# Patient Record
Sex: Male | Born: 1995 | Race: Black or African American | Hispanic: No | Marital: Single | Smoking: Never smoker
Health system: Southern US, Community
[De-identification: ages and names within clinical notes are randomized; demographics above are authoritative.]

## PROBLEM LIST (undated history)

## (undated) DIAGNOSIS — J45909 Unspecified asthma, uncomplicated: Secondary | ICD-10-CM

## (undated) DIAGNOSIS — G40909 Epilepsy, unspecified, not intractable, without status epilepticus: Secondary | ICD-10-CM

## (undated) DIAGNOSIS — F319 Bipolar disorder, unspecified: Secondary | ICD-10-CM

## (undated) DIAGNOSIS — S29011S Strain of muscle and tendon of front wall of thorax, sequela: Secondary | ICD-10-CM

## (undated) HISTORY — PX: KNEE SURGERY: SHX244

## (undated) HISTORY — PX: HERNIA REPAIR: SHX51

---
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
## 2014-07-06 ENCOUNTER — Emergency Department
Admission: EM | Admit: 2014-07-06 | Discharge: 2014-07-06 | Disposition: A | Discharge status: Home / Self Care | Payer: Medicaid Other | Attending: Emergency Medicine | Admitting: Emergency Medicine

## 2014-07-06 ENCOUNTER — Emergency Department: Payer: Medicaid Other

## 2014-07-06 DIAGNOSIS — S61313A Laceration without foreign body of left middle finger with damage to nail, initial encounter: Secondary | ICD-10-CM | POA: Diagnosis not present

## 2014-07-06 DIAGNOSIS — Y93H2 Activity, gardening and landscaping: Secondary | ICD-10-CM | POA: Diagnosis not present

## 2014-07-06 DIAGNOSIS — F419 Anxiety disorder, unspecified: Secondary | ICD-10-CM | POA: Diagnosis not present

## 2014-07-06 DIAGNOSIS — Y998 Other external cause status: Secondary | ICD-10-CM | POA: Insufficient documentation

## 2014-07-06 DIAGNOSIS — Y9289 Other specified places as the place of occurrence of the external cause: Secondary | ICD-10-CM | POA: Insufficient documentation

## 2014-07-06 DIAGNOSIS — S6992XA Unspecified injury of left wrist, hand and finger(s), initial encounter: Secondary | ICD-10-CM | POA: Diagnosis present

## 2014-07-06 DIAGNOSIS — W28XXXA Contact with powered lawn mower, initial encounter: Secondary | ICD-10-CM | POA: Diagnosis not present

## 2014-07-06 DIAGNOSIS — S61319A Laceration without foreign body of unspecified finger with damage to nail, initial encounter: Secondary | ICD-10-CM

## 2014-07-06 HISTORY — DX: Unspecified asthma, uncomplicated: J45.909

## 2014-07-06 MED ORDER — NAPROXEN 500 MG PO TABS
ORAL_TABLET | ORAL | Status: AC
  Administered 2014-07-06: 500 mg via ORAL
  Filled 2014-07-06: qty 1

## 2014-07-06 MED ORDER — CEPHALEXIN 250 MG PO CAPS: 250.0000 mg | ORAL_CAPSULE | Freq: Four times a day (QID) | ORAL | Status: AC

## 2014-07-06 MED ORDER — NAPROXEN 500 MG PO TABS
500.0000 mg | ORAL_TABLET | Freq: Two times a day (BID) | ORAL | Status: DC
Start: 2014-07-06 — End: 2015-07-09

## 2014-07-06 MED ORDER — ACETAMINOPHEN 500 MG PO TABS
1000.0000 mg | ORAL_TABLET | Freq: Once | ORAL | Status: AC
  Administered 2014-07-06: 1000 mg via ORAL

## 2014-07-06 MED ORDER — NAPROXEN 500 MG PO TABS
500.0000 mg | ORAL_TABLET | Freq: Once | ORAL | Status: AC
  Administered 2014-07-06: 500 mg via ORAL

## 2014-07-06 MED ORDER — ACETAMINOPHEN 500 MG PO TABS
ORAL_TABLET | ORAL | Status: AC
  Filled 2014-07-06: qty 2

## 2014-07-06 NOTE — ED Notes (Signed)
Pt reports to ED via EMS.  Pt presents w/ finger injury.  Finger is wrapped.  Per EMS pt was mowing lawn when he stuck hand in lawn mower

## 2014-07-06 NOTE — ED Notes (Signed)
During MD assessment, patient informed staff that he did not want any meds that would make him drowsy. Less than 5 minutes post tylenol administration, patient's mother at nursing station appearing upset. Patient's mother states that the tylenol isn't working and she wants something stronger for patient. ED MD aware. Ice pack placed on patient's finger and naproxen administered. Patient continues to say he does not want any medications that will make him drowsy. Mom at bedside stating that patient told her he wanted meds to make him drowsy. Patient continues to state that he does not want any meds that could make him drowsy, stating, "I didn't say that... You're putting words in my mouth". Will continue to monitor. Call bell in reach.

## 2014-07-06 NOTE — ED Provider Notes (Signed)
[facility] Emergency Department Provider Note  ____________________________________________  Time seen: On arrival, via EMS  I have reviewed the triage vital signs and the nursing notes.   HISTORY  Chief Complaint Finger Injury   Patient is extremely anxious, there may be some sort of unspecified developmental delay  HPI Larry Ball is a 19 y.o. male presents to the emergency department after a left middle finger tip laceration. Patient apparently lifted a lawnmower while was turned on and had the end of his left middle finger struck by a blade. The laceration is through the distal tip of the fingernail but no partial or total avulsion. Patient is acutely anxious and family as well     Past Medical History  Diagnosis Date  . Asthma     There are no active problems to display for this patient.   Past Surgical History  Procedure Laterality Date  . Knee surgery      No current outpatient prescriptions on file.  Allergies Review of patient's allergies indicates no known allergies.  No family history on file.  Social History History  Substance Use Topics  . Smoking status: Never Smoker   . Smokeless tobacco: Not on file  . Alcohol Use: No    Review of Systems  Constitutional: Negative for fever.  Muscular skeletal: No other injuries Skin: Positive for laceration. Neurological: Negative for headaches or focal weakness Psychiatric: Positive for anxiety    ____________________________________________   PHYSICAL EXAM:  VITAL SIGNS: ED Triage Vitals  Enc Vitals Group     BP 07/06/14 0940 138/99 mmHg     Pulse Rate 07/06/14 0940 98     Resp 07/06/14 0940 16     Temp 07/06/14 0940 97.7 F (36.5 C)     Temp Source 07/06/14 0940 Oral     SpO2 07/06/14 0940 100 %     Weight --      Height --      Head Cir --      Peak Flow --      Pain Score 07/06/14 0941 10     Pain Loc --      Pain Edu? --      Excl. in GC? --       Constitutional: Alert and oriented. Well appearing but anxious  ENT   Head: Normocephalic and atraumatic.   Cardiovascular: Normal rate, regular rhythm. Normal and symmetric distal pulses are present in all extremities. No murmurs, rubs, or gallops. Respiratory: Normal respiratory effort without tachypnea nor retractions. Breath sounds are clear and equal bilaterally.   Musculoskeletal: Nontender with normal range of motion in all extremities. No lower extremity tenderness nor edema. Neurologic:  Normal speech and language. No gross focal neurologic deficits are appreciated. Skin:  Skin is warm, dry and intact. Patient with laceration through the dorsal, distal nailbed of left middle finger. Germinal matrix intact. Palmar surface with superficial laceration. Mild bleeding present Psychiatric: Patient is extremely anxious  ____________________________________________    LABS (pertinent positives/negatives)  Labs Reviewed - No data to display  ____________________________________________   EKG  None  ____________________________________________    RADIOLOGY  X-ray Reviewed by me.  Nondisplaced fracture distal aspect third distal phalanx   ____________________________________________   PROCEDURES  Procedure(s) performed: none  Critical Care performed:none  ____________________________________________   INITIAL IMPRESSION / ASSESSMENT AND PLAN / ED COURSE  Pertinent labs & imaging results that were available during my care of the patient were reviewed by me and considered in my  medical decision making (see chart for details).  Patient with distal nailbed injury. We will give Tylenol by mouth x-ray and ice the area. Mother is extremely angry at me for giving Tylenol and not something stronger although patient himself requested nothing that will make him sleepy. Reinforced that this is not a severe injury and that Tylenol is an appropriate first step until  we get an x-ray.  ----------------------------------------- 10:03 AM on 07/06/2014 -----------------------------------------  Mother is raising her voice at the nurse regarding Tylenol which was only given 3 minutes ago. The patient again reiterates that he does not want anything that makes him sleepy and is not requesting further medication  ____________________________________________ X-ray shows nondisplaced fracture of the distal aspect of the third distal phalanx. Wound carefully irrigated in the emergency department. We will bandage and prescribe antibiotics and have the patient follow up with orthopedics as needed. Mother agrees with this plan and is happier because patient is comfortable after naproxen and Tylenol and ice pack.  FINAL CLINICAL IMPRESSION(S) / ED DIAGNOSES  Final diagnoses:  Laceration of finger nail bed, initial encounter     Jene Every, MD 07/06/14 1054

## 2014-07-06 NOTE — Discharge Instructions (Signed)
Finger Avulsion  °When the tip of the finger is lost, a new nail may grow back if part of the fingernail is left. The new nail may be deformed. If just the tip of the finger is lost, no repair may be needed unless there is bone showing. If bone is showing, your caregiver may need to remove the protruding bone and put on a bandage. Your caregiver will do what is best for you. Most of the time when a fingertip is lost, the end will gradually grow back on and look fairly normal, but it may remain sensitive to pressure and temperature extremes for a long time. °HOME CARE INSTRUCTIONS  °· Keep your hand elevated above your heart to relieve pain and swelling. °· Keep your dressing dry and clean. °· Change your bandage in 24 hours or as directed. °· Only take over-the-counter or prescription medicines for pain, discomfort, or fever as directed by your caregiver. °· See your caregiver as needed for problems. °SEEK MEDICAL CARE IF:  °· You have increased pain, swelling, drainage, or bleeding. °· You have a fever. °· You have swelling that spreads from your finger and into your hand. °Make sure to check to see if you need a tetanus booster. °Document Released: 03/09/2001 Document Revised: 06/30/2011 Document Reviewed: 02/02/2008 °ExitCare® Patient Information ©2015 [employer]. This information is not intended to replace advice given to you by your health care provider. Make sure you discuss any questions you have with your health care provider. ° °

## 2014-11-08 ENCOUNTER — Encounter: Payer: Self-pay | Admitting: Emergency Medicine

## 2014-11-08 ENCOUNTER — Emergency Department
Admission: EM | Admit: 2014-11-08 | Discharge: 2014-11-08 | Disposition: A | Discharge status: Home / Self Care | Payer: Medicare Other | Attending: Emergency Medicine | Admitting: Emergency Medicine

## 2014-11-08 ENCOUNTER — Emergency Department: Payer: Medicare Other

## 2014-11-08 DIAGNOSIS — Z791 Long term (current) use of non-steroidal anti-inflammatories (NSAID): Secondary | ICD-10-CM | POA: Diagnosis not present

## 2014-11-08 DIAGNOSIS — M25461 Effusion, right knee: Secondary | ICD-10-CM

## 2014-11-08 DIAGNOSIS — M25561 Pain in right knee: Secondary | ICD-10-CM | POA: Diagnosis present

## 2014-11-08 DIAGNOSIS — Z79899 Other long term (current) drug therapy: Secondary | ICD-10-CM | POA: Diagnosis not present

## 2014-11-08 HISTORY — DX: Bipolar disorder, unspecified: F31.9

## 2014-11-08 MED ORDER — NAPROXEN 500 MG PO TABS
ORAL_TABLET | ORAL | Status: AC
  Administered 2014-11-08: 500 mg via ORAL
  Filled 2014-11-08: qty 1

## 2014-11-08 MED ORDER — IBUPROFEN 800 MG PO TABS: 800.0000 mg | ORAL_TABLET | Freq: Three times a day (TID) | ORAL | Status: DC | PRN

## 2014-11-08 MED ORDER — NAPROXEN 500 MG PO TABS
500.0000 mg | ORAL_TABLET | Freq: Once | ORAL | Status: AC
  Administered 2014-11-08: 500 mg via ORAL

## 2014-11-08 NOTE — Discharge Instructions (Signed)
Knee Effusion °Knee effusion means that you have extra fluid in your knee. This can cause pain. Your knee may be more difficult to bend and move. °HOME CARE °· Use crutches as told by your doctor. °· Wear a knee brace as told by your doctor. °· Apply ice to the swollen area: °¨ Put ice in a plastic bag. °¨ Place a towel between your skin and the bag. °¨ Leave the ice on for 20 minutes, 2-3 times per day. °· Keep your knee raised (elevated) when you are sitting or lying down. °· Take medicines only as told by your doctor. °· Do any rehabilitation or strengthening exercises as told by your doctor. °· Rest your knee as told by your doctor. You may start doing your normal activities again when your doctor says it is okay. °· Keep all follow-up visits as told by your doctor. This is important. °GET HELP IF:  °· You continue to have pain in your knee. °GET HELP RIGHT AWAY IF: °· You have increased swelling or redness of your knee. °· You have severe pain in your knee. °· You have a fever. °  °This information is not intended to replace advice given to you by your health care provider. Make sure you discuss any questions you have with your health care provider. °  °Document Released: 01/31/2010 Document Revised: 01/19/2014 Document Reviewed: 08/14/2013 °Elsevier Interactive Patient Education ©2016 [employer] ° °

## 2014-11-08 NOTE — ED Notes (Signed)
Pt to ed with c/o right leg pain, states hx of ACL repair on right knee last year.

## 2014-11-08 NOTE — ED Provider Notes (Signed)
[facility] Emergency Department Provider Note  ____________________________________________  Time seen: Approximately 2:50 PM  I have reviewed the triage vital signs and the nursing notes.   HISTORY  Chief Complaint Leg Pain   HPI Larry Ball is a 19 y.o. male who presents to the emergency department for evaluation of right knee pain. He states that he tore his anterior cruciate ligament and had surgery last February, however a few days ago the pain returned. The pain is different than before as it is only on the inside of his knee. He denies new injury.Pain is worse with ambulation.   Past Medical History  Diagnosis Date  . Asthma   . Bipolar 1 disorder (HCC)     There are no active problems to display for this patient.   Past Surgical History  Procedure Laterality Date  . Knee surgery    . Hernia repair      Current Outpatient Rx  Name  Route  Sig  Dispense  Refill  . lithium 300 MG tablet   Oral   Take 300 mg by mouth 3 (three) times daily.         Marland Kitchen ibuprofen (ADVIL,MOTRIN) 800 MG tablet   Oral   Take 1 tablet (800 mg total) by mouth every 8 (eight) hours as needed.   30 tablet   0   . naproxen (NAPROSYN) 500 MG tablet   Oral   Take 1 tablet (500 mg total) by mouth 2 (two) times daily with a meal.   20 tablet   2     Allergies Review of patient's allergies indicates no known allergies.  History reviewed. No pertinent family history.  Social History Social History  Substance Use Topics  . Smoking status: Never Smoker   . Smokeless tobacco: None  . Alcohol Use: No    Review of Systems Constitutional: No recent illness. Eyes: No visual changes. ENT: No sore throat. Cardiovascular: Denies chest pain or palpitations. Respiratory: Denies shortness of breath. Gastrointestinal: No abdominal pain.  Genitourinary: Negative for dysuria. Musculoskeletal: Pain in right knee Skin: Negative for rash. Neurological:  Negative for headaches, focal weakness or numbness. 10-point ROS otherwise negative.  ____________________________________________   PHYSICAL EXAM:  VITAL SIGNS: ED Triage Vitals  Enc Vitals Group     BP 11/08/14 1334 141/84 mmHg     Pulse Rate 11/08/14 1334 65     Resp 11/08/14 1334 20     Temp 11/08/14 1334 97.7 F (36.5 C)     Temp Source 11/08/14 1334 Oral     SpO2 11/08/14 1334 100 %     Weight 11/08/14 1334 202 lb (91.627 kg)     Height 11/08/14 1334 6' (1.829 m)     Head Cir --      Peak Flow --      Pain Score 11/08/14 1334 9     Pain Loc --      Pain Edu? --      Excl. in GC? --     Constitutional: Alert and oriented. Well appearing and in no acute distress. Eyes: Conjunctivae are normal. EOMI. Head: Atraumatic. Nose: No congestion/rhinnorhea. Neck: No stridor.  Respiratory: Normal respiratory effort.   Musculoskeletal: Medial joint line tenderness most noted on extension against resistance. Knee is tracking midline without crepitus. Negative anterior drawer test. Straight leg raise is normal. Neurologic:  Normal speech and language. No gross focal neurologic deficits are appreciated. Speech is normal. No gait instability. Skin:  Skin is  warm, dry and intact. Atraumatic. Psychiatric: Mood and affect are normal. Speech and behavior are normal.  ____________________________________________   LABS (all labs ordered are listed, but only abnormal results are displayed)  Labs Reviewed - No data to display ____________________________________________  RADIOLOGY  Tiny joint effusion noted on the x-ray.. ____________________________________________   PROCEDURES  Procedure(s) performed:   Knee immobilizer applied to the right knee by ER tech. Patient was neurovascularly intact post-application.   ____________________________________________   INITIAL IMPRESSION / ASSESSMENT AND PLAN / ED COURSE  Pertinent labs & imaging results that were available  during my care of the patient were reviewed by me and considered in my medical decision making (see chart for details).  Patient was advised to follow-up with orthopedics. He was advised to return to the emergency department for symptoms that change or worsen if he is unable schedule an appointment. ____________________________________________   FINAL CLINICAL IMPRESSION(S) / ED DIAGNOSES  Final diagnoses:  Effusion of knee joint right        Chinita PesterCari B Calianne Larue, FNP 11/08/14 1728  Emily FilbertJonathan E Williams, MD 11/09/14 2248

## 2014-11-08 NOTE — ED Notes (Signed)
States he had surgery about 1 year ago has had some pain but not pain is increased   No swelling noted pain is to lateral aspect ambulates well to treatment area

## 2015-04-21 ENCOUNTER — Emergency Department
Admission: EM | Admit: 2015-04-21 | Discharge: 2015-04-21 | Disposition: A | Discharge status: Home / Self Care | Payer: Medicare Other | Attending: Emergency Medicine | Admitting: Emergency Medicine

## 2015-04-21 ENCOUNTER — Encounter: Payer: Self-pay | Admitting: Emergency Medicine

## 2015-04-21 DIAGNOSIS — F319 Bipolar disorder, unspecified: Secondary | ICD-10-CM | POA: Insufficient documentation

## 2015-04-21 DIAGNOSIS — J45909 Unspecified asthma, uncomplicated: Secondary | ICD-10-CM | POA: Insufficient documentation

## 2015-04-21 DIAGNOSIS — Z79899 Other long term (current) drug therapy: Secondary | ICD-10-CM | POA: Insufficient documentation

## 2015-04-21 DIAGNOSIS — Z791 Long term (current) use of non-steroidal anti-inflammatories (NSAID): Secondary | ICD-10-CM | POA: Diagnosis not present

## 2015-04-21 DIAGNOSIS — R040 Epistaxis: Secondary | ICD-10-CM | POA: Diagnosis present

## 2015-04-21 MED ORDER — FLUTICASONE PROPIONATE 50 MCG/ACT NA SUSP: 1.0000 | Freq: Two times a day (BID) | NASAL | Status: DC

## 2015-04-21 MED ORDER — CETIRIZINE HCL 10 MG PO TABS: 10.0000 mg | ORAL_TABLET | Freq: Every day | ORAL | Status: DC

## 2015-04-21 NOTE — Discharge Instructions (Signed)

## 2015-04-21 NOTE — ED Notes (Signed)
Pt presents with epistaxis this afternoon while cutting grass. No active bleeding now. Pt denies any injury to nose. Pt states has had rhinorrhea and reports seasonal allergies. Dried blood noted to nostrils and face.

## 2015-04-21 NOTE — ED Provider Notes (Signed)
[facility] Emergency Department Provider Note  ____________________________________________  Time seen: Approximately 4:46 PM  I have reviewed the triage vital signs and the nursing notes.   HISTORY  Chief Complaint Epistaxis    HPI Larry Ball is a 20 y.o. male who presents to emergency department for complaint of nosebleed this afternoon. Patient states that he does have seasonal allergies and was suffering from sneezing and runny nose this afternoon. He states that he was rubbing his nose began to bleed. 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 He was concerned and sought out his father who applied ice and direct pressure. After this, nosebleed was successfully stopped. Patient has had no return of symptoms. Patient denies any headache, visual changes, neck pain, chest pain, shortness of breath, nausea or vomiting.   Past Medical History  Diagnosis Date  . Asthma   . Bipolar 1 disorder (HCC)     There are no active problems to display for this patient.   Past Surgical History  Procedure Laterality Date  . Knee surgery    . Hernia repair      Current Outpatient Rx  Name  Route  Sig  Dispense  Refill  . cetirizine (ZYRTEC) 10 MG tablet   Oral   Take 1 tablet (10 mg total) by mouth daily.   30 tablet   0   . fluticasone (FLONASE) 50 MCG/ACT nasal spray   Each Nare   Place 1 spray into both nostrils 2 (two) times daily.   16 g   0   . ibuprofen (ADVIL,MOTRIN) 800 MG tablet   Oral   Take 1 tablet (800 mg total) by mouth every 8 (eight) hours as needed.   30 tablet   0   . lithium 300 MG tablet   Oral   Take 300 mg by mouth 3 (three) times daily.         . naproxen (NAPROSYN) 500 MG tablet   Oral   Take 1 tablet (500 mg total) by mouth 2 (two) times daily with a meal.   20 tablet   2     Allergies Review of patient's allergies indicates no known allergies.  History reviewed. No pertinent family history.  Social  History Social History  Substance Use Topics  . Smoking status: Never Smoker   . Smokeless tobacco: None  . Alcohol Use: No     Review of Systems  Constitutional: No fever/chills Eyes: No visual changes. No discharge ENT: No sore throat.Positive for rhinorrhea. Positive for epistaxis. Denies ear pain. Cardiovascular: no chest pain. Respiratory: no cough. No SOB. Skin: Negative for rash. Neurological: Negative for headaches, focal weakness or numbness. 10-point ROS otherwise negative.  ____________________________________________   PHYSICAL EXAM:  VITAL SIGNS: ED Triage Vitals  Enc Vitals Group     BP --      Pulse --      Resp --      Temp --      Temp src --      SpO2 --      Weight --      Height --      Head Cir --      Peak Flow --      Pain Score --      Pain Loc --      Pain Edu? --      Excl. in GC? --      Constitutional: Alert and oriented. Well appearing and in no acute distress. Eyes:  Conjunctivae are normal. PERRL. EOMI. Head: Atraumatic. ENT:      Ears: EACs and TMs are unremarkable bilaterally.      Nose: No congestion/rhinnorhea. Turbinates are boggy. There is scabbing over in the left nares over the Kiesselbach plexus. No current bleeding.      Mouth/Throat: Mucous membranes are moist. Oropharynx is nonerythematous and nonedematous. Neck: No stridor.   Cardiovascular: Normal rate, regular rhythm. Normal S1 and S2.  Good peripheral circulation. Respiratory: Normal respiratory effort without tachypnea or retractions. Lungs CTAB. Neurologic:  Normal speech and language. No gross focal neurologic deficits are appreciated.  Skin:  Skin is warm, dry and intact. No rash noted. Psychiatric: Mood and affect are normal. Speech and behavior are normal.   ____________________________________________   LABS (all labs ordered are listed, but only abnormal results are displayed)  Labs Reviewed - No data to  display ____________________________________________  EKG   ____________________________________________  RADIOLOGY   No results found.  ____________________________________________    PROCEDURES  Procedure(s) performed:       Medications - No data to display   ____________________________________________   INITIAL IMPRESSION / ASSESSMENT AND PLAN / ED COURSE  Pertinent labs & imaging results that were available during my care of the patient were reviewed by me and considered in my medical decision making (see chart for details).  Patient's diagnosis is consistent with epistaxis. Currently there is no bleeding. No need for Afrin or nasal packing. Patient does have a significant history of seasonal allergies and it is felt that epistaxis is directly related to this. As such, Patient will be discharged home with prescriptions for Flonase and Zyrtec. Patient is to follow up with ENT if symptoms persist past this treatment course. Patient is given ED precautions to return to the ED for any worsening or new symptoms.     ____________________________________________  FINAL CLINICAL IMPRESSION(S) / ED DIAGNOSES  Final diagnoses:  Epistaxis      NEW MEDICATIONS STARTED DURING THIS VISIT:  Discharge Medication List as of 04/21/2015  4:42 PM    START taking these medications   Details  cetirizine (ZYRTEC) 10 MG tablet Take 1 tablet (10 mg total) by mouth daily., Starting 04/21/2015, Until Discontinued, Print    fluticasone (FLONASE) 50 MCG/ACT nasal spray Place 1 spray into both nostrils 2 (two) times daily., Starting 04/21/2015, Until Discontinued, Print            This chart was dictated using voice recognition software/Dragon. Despite best efforts to proofread, errors can occur which can change the meaning. Any change was purely unintentional.    Racheal PatchesJonathan D Kemaria Dedic, PA-C 04/21/15 1703  Sharman CheekPhillip Stafford, MD 04/22/15 2351

## 2015-04-21 NOTE — ED Notes (Signed)
Had a nose bleed at home and was spitting up blood from it

## 2015-04-21 NOTE — ED Notes (Signed)
Mother very aggressive - had to be escorted out by officers. Mother accusing me during triage of being prejudice but this nurse unsure why since i was asking symptom onset

## 2015-04-22 MED ORDER — ADENOSINE 6 MG/2ML IV SOLN
INTRAVENOUS | Status: AC
  Filled 2015-04-22: qty 2

## 2015-07-09 ENCOUNTER — Emergency Department
Admission: EM | Admit: 2015-07-09 | Discharge: 2015-07-09 | Disposition: A | Discharge status: Home / Self Care | Payer: Medicare Other | Attending: Emergency Medicine | Admitting: Emergency Medicine

## 2015-07-09 DIAGNOSIS — S0006XD Insect bite (nonvenomous) of scalp, subsequent encounter: Secondary | ICD-10-CM | POA: Diagnosis present

## 2015-07-09 DIAGNOSIS — Z79899 Other long term (current) drug therapy: Secondary | ICD-10-CM | POA: Diagnosis not present

## 2015-07-09 DIAGNOSIS — W57XXXD Bitten or stung by nonvenomous insect and other nonvenomous arthropods, subsequent encounter: Secondary | ICD-10-CM | POA: Insufficient documentation

## 2015-07-09 DIAGNOSIS — J45909 Unspecified asthma, uncomplicated: Secondary | ICD-10-CM | POA: Insufficient documentation

## 2015-07-09 DIAGNOSIS — F319 Bipolar disorder, unspecified: Secondary | ICD-10-CM | POA: Insufficient documentation

## 2015-07-09 MED ORDER — DOXYCYCLINE HYCLATE 50 MG PO CAPS: 100.0000 mg | ORAL_CAPSULE | Freq: Two times a day (BID) | ORAL | Status: DC

## 2015-07-09 MED ORDER — ONDANSETRON HCL 4 MG/2ML IJ SOLN
INTRAMUSCULAR | Status: AC
Start: 2015-07-09 — End: 2015-07-10
  Filled 2015-07-09: qty 2

## 2015-07-09 NOTE — ED Notes (Addendum)
Pt reports he's pulled 4 ticks off of him, wants his hair checked for more- pt with dreadlocks. Pt also states "I need to be checked for rocky mountain spotted fever"; reports headache. Pt ambulatory to triage, no difficulty or distress noted. Pt unsure if any actual tick bite.

## 2015-07-09 NOTE — ED Notes (Signed)
Pt informed to return if any life threatening symptoms occur.  

## 2015-07-09 NOTE — ED Notes (Signed)
See triage note. States he was in an area that was full of ticks this am and found several ticks on him

## 2015-07-09 NOTE — ED Provider Notes (Signed)
[facility] Emergency Department Provider Note  ____________________________________________  Time seen: Approximately 1:18 PM  I have reviewed the triage vital signs and the nursing notes.   HISTORY  Chief Complaint ticks in hair     HPI Larry Ball is a 20 y.o. male presents for evaluation of exposure to ticks prior to arrival. Patient states he is walking and woods and found 45 ticks on room and wants to be examined for any remaining ticks. Patient also wants to be tested for [facility] and Lyme disease.   Past Medical History  Diagnosis Date  . Asthma   . Bipolar 1 disorder (HCC)     There are no active problems to display for this patient.   Past Surgical History  Procedure Laterality Date  . Knee surgery    . Hernia repair      Current Outpatient Rx  Name  Route  Sig  Dispense  Refill  . doxycycline (VIBRAMYCIN) 50 MG capsule   Oral   Take 2 capsules (100 mg total) by mouth 2 (two) times daily.   2 capsule   0   . lithium 300 MG tablet   Oral   Take 300 mg by mouth 3 (three) times daily.           Allergies Review of patient's allergies indicates no known allergies.  No family history on file.  Social History Social History  Substance Use Topics  . Smoking status: Never Smoker   . Smokeless tobacco: Not on file  . Alcohol Use: No    Review of Systems Constitutional: No fever/chills Eyes: No visual changes. ENT: No sore throat. Cardiovascular: Denies chest pain. Respiratory: Denies shortness of breath. Gastrointestinal: No abdominal pain.  No nausea, no vomiting.  No diarrhea.  No constipation. Genitourinary: Negative for dysuria. Musculoskeletal: Negative for back pain. Skin: Negative for rash.Positive for tick exposure. Neurological: Negative for headaches, focal weakness or numbness.  10-point ROS otherwise negative.  ____________________________________________   PHYSICAL  EXAM:  VITAL SIGNS: ED Triage Vitals  Enc Vitals Group     BP 07/09/15 1245 145/87 mmHg     Pulse Rate 07/09/15 1245 65     Resp 07/09/15 1245 20     Temp 07/09/15 1245 97.9 F (36.6 C)     Temp Source 07/09/15 1245 Oral     SpO2 07/09/15 1245 97 %     Weight 07/09/15 1245 210 lb (95.255 kg)     Height 07/09/15 1245 5\' 10"  (1.778 m)     Head Cir --      Peak Flow --      Pain Score 07/09/15 1245 9     Pain Loc --      Pain Edu? --      Excl. in GC? --     Constitutional: Alert and oriented. Well appearing and in no acute distress. Head: Atraumatic. Nose: No congestion/rhinnorhea. Mouth/Throat: Mucous membranes are moist.  Oropharynx non-erythematous. Neck: No stridor.   Cardiovascular: Normal rate, regular rhythm. Grossly normal heart sounds.  Good peripheral circulation. Respiratory: Normal respiratory effort.  No retractions. Lungs CTAB. Gastrointestinal: Soft and nontender. No distention. No abdominal bruits. No CVA tenderness. Musculoskeletal: No lower extremity tenderness nor edema.  No joint effusions. Neurologic:  Normal speech and language. No gross focal neurologic deficits are appreciated. No gait instability. Skin:  Skin is warm, dry and intact. No rash noted.No evidence of ticks noted. Psychiatric: Mood and affect are normal. Speech and  behavior are normal.  ____________________________________________   LABS (all labs ordered are listed, but only abnormal results are displayed)  Labs Reviewed  ROCKY MTN SPOTTED FVR ABS PNL(IGG+IGM)  B. BURGDORFI ANTIBODIES   ____________________________________________  EKG   ____________________________________________  RADIOLOGY   ____________________________________________   PROCEDURES  Procedure(s) performed: None  Critical Care performed: No  ____________________________________________   INITIAL IMPRESSION / ASSESSMENT AND PLAN / ED COURSE  Pertinent labs & imaging results that were available  during my care of the patient were reviewed by me and considered in my medical decision making (see chart for details).  Tick exposure. Reassurance provided to the patient labs drawn for [facility] and Lyme disease. Patient was given doxycycline 100 mg by mouth 1 dose. ____________________________________________   FINAL CLINICAL IMPRESSION(S) / ED DIAGNOSES  Final diagnoses:  Tick bite of scalp, subsequent encounter     This chart was dictated using voice recognition software/Dragon. Despite best efforts to proofread, errors can occur which can change the meaning. Any change was purely unintentional.   Evangeline Dakinharles M Amedio Bowlby, PA-C 07/09/15 1418  Emily FilbertJonathan E Williams, MD 07/09/15 [phone]

## 2015-07-11 LAB — ROCKY MTN SPOTTED FVR ABS PNL(IGG+IGM)
RMSF IGG: NEGATIVE
RMSF IgM: 0.35 index (ref 0.00–0.89)

## 2015-07-11 LAB — B. BURGDORFI ANTIBODIES

## 2015-07-12 ENCOUNTER — Telehealth: Payer: Self-pay | Admitting: Emergency Medicine

## 2015-07-12 NOTE — ED Notes (Signed)
Called patient to give results of rmsf/lyme and discuss follow up plans.  No answer and no voicemail.  Also noticed doxycycline rx has quantity of only 2--wanted to ask patient if he received enough for 10 days from pharmacy or just the 2.

## 2015-11-26 ENCOUNTER — Encounter: Payer: Self-pay | Admitting: Emergency Medicine

## 2015-11-26 ENCOUNTER — Emergency Department: Payer: Medicare Other

## 2015-11-26 ENCOUNTER — Emergency Department
Admission: EM | Admit: 2015-11-26 | Discharge: 2015-11-26 | Disposition: A | Discharge status: Home / Self Care | Payer: Medicare Other | Attending: Emergency Medicine | Admitting: Emergency Medicine

## 2015-11-26 DIAGNOSIS — R1032 Left lower quadrant pain: Secondary | ICD-10-CM | POA: Diagnosis not present

## 2015-11-26 DIAGNOSIS — R112 Nausea with vomiting, unspecified: Secondary | ICD-10-CM | POA: Insufficient documentation

## 2015-11-26 DIAGNOSIS — J45909 Unspecified asthma, uncomplicated: Secondary | ICD-10-CM | POA: Diagnosis not present

## 2015-11-26 DIAGNOSIS — Z79899 Other long term (current) drug therapy: Secondary | ICD-10-CM | POA: Insufficient documentation

## 2015-11-26 LAB — URINALYSIS COMPLETE WITH MICROSCOPIC (ARMC ONLY)
BILIRUBIN URINE: NEGATIVE
Bacteria, UA: NONE SEEN
Glucose, UA: NEGATIVE mg/dL
Hgb urine dipstick: NEGATIVE
KETONES UR: NEGATIVE mg/dL
Leukocytes, UA: NEGATIVE
Nitrite: NEGATIVE
Protein, ur: 30 mg/dL — AB
Specific Gravity, Urine: 1.027 (ref 1.005–1.030)
pH: 5 (ref 5.0–8.0)

## 2015-11-26 LAB — COMPREHENSIVE METABOLIC PANEL
ALT: 28 U/L (ref 17–63)
AST: 26 U/L (ref 15–41)
Albumin: 4.3 g/dL (ref 3.5–5.0)
Alkaline Phosphatase: 60 U/L (ref 38–126)
Anion gap: 8 (ref 5–15)
BUN: 16 mg/dL (ref 6–20)
CHLORIDE: 105 mmol/L (ref 101–111)
CO2: 26 mmol/L (ref 22–32)
Calcium: 9.2 mg/dL (ref 8.9–10.3)
Creatinine, Ser: 1.11 mg/dL (ref 0.61–1.24)
Glucose, Bld: 132 mg/dL — ABNORMAL HIGH (ref 65–99)
Potassium: 4.2 mmol/L (ref 3.5–5.1)
Sodium: 139 mmol/L (ref 135–145)
Total Bilirubin: 0.6 mg/dL (ref 0.3–1.2)
Total Protein: 8 g/dL (ref 6.5–8.1)

## 2015-11-26 LAB — CBC
HCT: 47 % (ref 40.0–52.0)
Hemoglobin: 16.1 g/dL (ref 13.0–18.0)
MCH: 30.7 pg (ref 26.0–34.0)
MCHC: 34.2 g/dL (ref 32.0–36.0)
MCV: 89.6 fL (ref 80.0–100.0)
PLATELETS: 289 10*3/uL (ref 150–440)
RBC: 5.25 MIL/uL (ref 4.40–5.90)
RDW: 12.3 % (ref 11.5–14.5)
WBC: 13.5 10*3/uL — ABNORMAL HIGH (ref 3.8–10.6)

## 2015-11-26 LAB — LIPASE, BLOOD: LIPASE: 22 U/L (ref 11–51)

## 2015-11-26 MED ORDER — MORPHINE SULFATE (PF) 4 MG/ML IV SOLN
INTRAVENOUS | Status: AC
  Administered 2015-11-26: 4 mg via INTRAVENOUS
  Filled 2015-11-26: qty 1

## 2015-11-26 MED ORDER — GI COCKTAIL ~~LOC~~
30.0000 mL | Freq: Once | ORAL | Status: AC
  Administered 2015-11-26: 30 mL via ORAL
  Filled 2015-11-26: qty 30

## 2015-11-26 MED ORDER — METOCLOPRAMIDE HCL 10 MG PO TABS: 10.0000 mg | ORAL_TABLET | Freq: Three times a day (TID) | ORAL | 0 refills | Status: DC | PRN

## 2015-11-26 MED ORDER — IOPAMIDOL (ISOVUE-300) INJECTION 61%
30.0000 mL | Freq: Once | INTRAVENOUS | Status: AC | PRN
  Administered 2015-11-26: 30 mL via ORAL

## 2015-11-26 MED ORDER — ONDANSETRON HCL 4 MG/2ML IJ SOLN
4.0000 mg | Freq: Once | INTRAMUSCULAR | Status: AC
  Administered 2015-11-26: 4 mg via INTRAVENOUS

## 2015-11-26 MED ORDER — SODIUM CHLORIDE 0.9 % IV BOLUS (SEPSIS)
1000.0000 mL | Freq: Once | INTRAVENOUS | Status: AC
  Administered 2015-11-26: 1000 mL via INTRAVENOUS

## 2015-11-26 MED ORDER — IOPAMIDOL (ISOVUE-300) INJECTION 61%
100.0000 mL | Freq: Once | INTRAVENOUS | Status: AC | PRN
  Administered 2015-11-26: 100 mL via INTRAVENOUS

## 2015-11-26 MED ORDER — ETODOLAC 200 MG PO CAPS: 200.0000 mg | ORAL_CAPSULE | Freq: Three times a day (TID) | ORAL | 0 refills | Status: DC

## 2015-11-26 MED ORDER — KETOROLAC TROMETHAMINE 30 MG/ML IJ SOLN
30.0000 mg | Freq: Once | INTRAMUSCULAR | Status: AC
  Administered 2015-11-26: 30 mg via INTRAVENOUS
  Filled 2015-11-26: qty 1

## 2015-11-26 MED ORDER — MORPHINE SULFATE (PF) 4 MG/ML IV SOLN
4.0000 mg | Freq: Once | INTRAVENOUS | Status: AC
  Administered 2015-11-26: 4 mg via INTRAVENOUS

## 2015-11-26 MED ORDER — METOCLOPRAMIDE HCL 5 MG/ML IJ SOLN
10.0000 mg | Freq: Once | INTRAMUSCULAR | Status: AC
  Administered 2015-11-26: 10 mg via INTRAVENOUS
  Filled 2015-11-26: qty 2

## 2015-11-26 MED ORDER — ONDANSETRON HCL 4 MG/2ML IJ SOLN
INTRAMUSCULAR | Status: AC
  Administered 2015-11-26: 4 mg via INTRAVENOUS
  Filled 2015-11-26: qty 2

## 2015-11-26 NOTE — ED Notes (Signed)
Pt was awakened tonight with left side abd pain.  Pt reports n/v.  No diarrhea. No urinary sx   No back pain.  Pt alert.  Mother with pt.

## 2015-11-26 NOTE — ED Notes (Signed)
Pt back from CT. Pt requesting more pain meds. Pt noted to be drowsy at this time. Pt given warm blankets and MD made aware of request.

## 2015-11-26 NOTE — ED Notes (Signed)
Pt requesting more pain meds before going to CT. MD  Aware. Notfied Pt that MD wants CT first before given more pain meds. Will continue to monitor.

## 2015-11-26 NOTE — ED Notes (Signed)
Patient transported to CT 

## 2015-11-26 NOTE — ED Triage Notes (Signed)
Patient ambulatory to triage with steady gait, without difficulty or distress noted; pt reports left sided abd pain accomp by N/V tonight

## 2015-11-26 NOTE — ED Provider Notes (Addendum)
[facility] Emergency Department Provider Note   ____________________________________________   First MD Initiated Contact with Patient 11/26/15 0246     (approximate)  I have reviewed the triage vital signs and the nursing notes.   HISTORY  Chief Complaint Abdominal Pain    HPI Larry Ball is a 20 y.o. male who comes into the hospital today with abdominal pain. The patient reports that he went to bed feeling fine at around 10:30. The patient woke up at 1:40 AM with some vomiting and left-sided abdominal pain. The patient reports that he vomited approximately 10 times of yellowish emesis. He denies any blood in the emesis or diarrhea. The patient denies any sick contacts and rates his pain 10 out of 10 in intensity. The patient reports it is sharp pain. He denies any chest pain, shortness breath, fever. He has no pain with urination blood in his urine or back pain. The patient is here for evaluation. Patient did not take any medication for pain at home. Nothing seems to make the patient's pain better or worse.The patient denies any testicular swelling or pain.   Past Medical History:  Diagnosis Date  . Asthma   . Bipolar 1 disorder (HCC)     There are no active problems to display for this patient.   Past Surgical History:  Procedure Laterality Date  . HERNIA REPAIR    . KNEE SURGERY      Prior to Admission medications   Medication Sig Start Date End Date Taking? Authorizing Provider  doxycycline (VIBRAMYCIN) 50 MG capsule Take 2 capsules (100 mg total) by mouth 2 (two) times daily. 07/09/15   Evangeline Dakinharles M Beers, PA-C  etodolac (LODINE) 200 MG capsule Take 1 capsule (200 mg total) by mouth every 8 (eight) hours. 11/26/15   Rebecka ApleyAllison P Freeland Pracht, MD  lithium 300 MG tablet Take 300 mg by mouth 3 (three) times daily.    Historical Provider, MD  metoCLOPramide (REGLAN) 10 MG tablet Take 1 tablet (10 mg total) by mouth every 8 (eight) hours as needed.  11/26/15   Rebecka ApleyAllison P Normajean Nash, MD    Allergies Patient has no known allergies.  No family history on file.  Social History Social History  Substance Use Topics  . Smoking status: Never Smoker  . Smokeless tobacco: Never Used  . Alcohol use No    Review of Systems Constitutional: No fever/chills Eyes: No visual changes. ENT: No sore throat. Cardiovascular: Denies chest pain. Respiratory: denies shortness of breath. Gastrointestinal:  abdominal pain, nausea, vomiting.  No diarrhea.  No constipation. Genitourinary: Negative for dysuria. Musculoskeletal: Negative for back pain. Skin: Negative for rash. Neurological: Negative for headaches, focal weakness or numbness.  10-point ROS otherwise negative.  ____________________________________________   PHYSICAL EXAM:  VITAL SIGNS: ED Triage Vitals [11/26/15 0236]  Enc Vitals Group     BP 117/80     Pulse Rate 82     Resp 20     Temp 97.9 F (36.6 C)     Temp Source Oral     SpO2 97 %     Weight 200 lb (90.7 kg)     Height 5\' 9"  (1.753 m)     Head Circumference      Peak Flow      Pain Score 10     Pain Loc      Pain Edu?      Excl. in GC?     Constitutional: Alert and oriented. Well appearing and in moderate  distress. Eyes: Conjunctivae are normal. PERRL. EOMI. Head: Atraumatic. Nose: No congestion/rhinnorhea. Mouth/Throat: Mucous membranes are moist.  Oropharynx non-erythematous. Cardiovascular: Normal rate, regular rhythm. Grossly normal heart sounds.  Good peripheral circulation. Respiratory: Normal respiratory effort.  No retractions. Lungs CTAB. Gastrointestinal: Soft with LLq pain to palpation. No distention. Positive bowel sounds Musculoskeletal: No lower extremity tenderness nor edema.  Neurologic:  Normal speech and language.  Skin:  Skin is warm, dry and intact.  Psychiatric: Mood and affect are normal.   ____________________________________________   LABS (all labs ordered are listed, but only  abnormal results are displayed)  Labs Reviewed  COMPREHENSIVE METABOLIC PANEL - Abnormal; Notable for the following:       Result Value   Glucose, Bld 132 (*)    All other components within normal limits  CBC - Abnormal; Notable for the following:    WBC 13.5 (*)    All other components within normal limits  URINALYSIS COMPLETEWITH MICROSCOPIC ([facility] ONLY) - Abnormal; Notable for the following:    Color, Urine YELLOW (*)    APPearance CLEAR (*)    Protein, ur 30 (*)    Squamous Epithelial / LPF 0-5 (*)    All other components within normal limits  LIPASE, BLOOD   ____________________________________________  EKG  none ____________________________________________  RADIOLOGY  CT abd and pelvis ____________________________________________   PROCEDURES  Procedure(s) performed: None  Procedures  Critical Care performed: No  ____________________________________________   INITIAL IMPRESSION / ASSESSMENT AND PLAN / ED COURSE  Pertinent labs & imaging results that were available during my care of the patient were reviewed by me and considered in my medical decision making (see chart for details).  This is a 20 year old male who comes into the hospital today with some abdominal pain and vomiting. The patient reports that he woke up with these symptoms. He is here for evaluation. I will give him a dose of morphine and check some blood work. I will also give the patient liter of normal saline.  After the pain medicine the patient reports that his pain only went down from a 10 to an 8. I will give him a second dose of morphine and I will send the patient for CT scan of his abdomen and pelvis.  Clinical Course as of Nov 25 712  Tue Nov 26, 2015  1610 1. No CT evidence for acute intra-abdominal or pelvic process. 2. Prominent distension of the stomach without additional evidence for mechanical bowel obstruction or ileus. 3. Right kidney positioned within the pelvis.   CT  Abdomen Pelvis W Contrast [AW]    Clinical Course User Index [AW] Rebecka Apley, MD   I did go back into the room to inform the patient the results of his ultrasound. The patient was sleeping. When I told him that I was going to discharge him home he was saying that he was still in a lot of pain. The patient is laying calmly and comfortably on the bed. I informed him that he may be developing a stomach bug causing these symptoms. I will give the patient a dose of Toradol as well as a GI cocktail. The patient will be discharged home to follow-up with the acute care clinic.  ____________________________________________   FINAL CLINICAL IMPRESSION(S) / ED DIAGNOSES  Final diagnoses:  Left lower quadrant pain  Nausea and vomiting, intractability of vomiting not specified, unspecified vomiting type      NEW MEDICATIONS STARTED DURING THIS VISIT:  New Prescriptions   ETODOLAC (LODINE)  200 MG CAPSULE    Take 1 capsule (200 mg total) by mouth every 8 (eight) hours.   METOCLOPRAMIDE (REGLAN) 10 MG TABLET    Take 1 tablet (10 mg total) by mouth every 8 (eight) hours as needed.     Note:  This document was prepared using Dragon voice recognition software and may include unintentional dictation errors.    Martavious Hartel P Maryah Marinaro, MD 11/Rebecka Apley14/17 0715    Rebecka ApleyAllison P Daizy Outen, MD 11/26/15 [phone]

## 2016-02-12 ENCOUNTER — Encounter: Payer: Self-pay | Admitting: Emergency Medicine

## 2016-02-12 ENCOUNTER — Emergency Department: Payer: Medicare Other

## 2016-02-12 ENCOUNTER — Emergency Department
Admission: EM | Admit: 2016-02-12 | Discharge: 2016-02-12 | Disposition: A | Discharge status: Home / Self Care | Payer: Medicare Other | Attending: Emergency Medicine | Admitting: Emergency Medicine

## 2016-02-12 DIAGNOSIS — J45909 Unspecified asthma, uncomplicated: Secondary | ICD-10-CM | POA: Insufficient documentation

## 2016-02-12 DIAGNOSIS — Y990 Civilian activity done for income or pay: Secondary | ICD-10-CM | POA: Diagnosis not present

## 2016-02-12 DIAGNOSIS — M79604 Pain in right leg: Secondary | ICD-10-CM

## 2016-02-12 DIAGNOSIS — S80811A Abrasion, right lower leg, initial encounter: Secondary | ICD-10-CM | POA: Diagnosis not present

## 2016-02-12 DIAGNOSIS — Y929 Unspecified place or not applicable: Secondary | ICD-10-CM | POA: Insufficient documentation

## 2016-02-12 DIAGNOSIS — W228XXA Striking against or struck by other objects, initial encounter: Secondary | ICD-10-CM | POA: Insufficient documentation

## 2016-02-12 DIAGNOSIS — Y9389 Activity, other specified: Secondary | ICD-10-CM | POA: Insufficient documentation

## 2016-02-12 DIAGNOSIS — S060X1A Concussion with loss of consciousness of 30 minutes or less, initial encounter: Secondary | ICD-10-CM | POA: Diagnosis not present

## 2016-02-12 DIAGNOSIS — M79605 Pain in left leg: Secondary | ICD-10-CM | POA: Insufficient documentation

## 2016-02-12 DIAGNOSIS — S0990XA Unspecified injury of head, initial encounter: Secondary | ICD-10-CM | POA: Diagnosis present

## 2016-02-12 MED ORDER — ACETAMINOPHEN 500 MG PO TABS
1000.0000 mg | ORAL_TABLET | Freq: Once | ORAL | Status: AC
  Administered 2016-02-12: 1000 mg via ORAL
  Filled 2016-02-12: qty 2

## 2016-02-12 MED ORDER — OXYCODONE-ACETAMINOPHEN 5-325 MG PO TABS: 1.0000 | ORAL_TABLET | ORAL | 0 refills | Status: DC | PRN

## 2016-02-12 MED ORDER — OXYCODONE HCL 5 MG PO TABS
5.0000 mg | ORAL_TABLET | Freq: Once | ORAL | Status: AC
  Administered 2016-02-12: 5 mg via ORAL
  Filled 2016-02-12: qty 1

## 2016-02-12 NOTE — ED Notes (Signed)
Pts leg was cleaned and dressing placed as ordered.
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
## 2016-02-14 DIAGNOSIS — F0781 Postconcussional syndrome: Secondary | ICD-10-CM | POA: Insufficient documentation

## 2016-02-14 DIAGNOSIS — G44309 Post-traumatic headache, unspecified, not intractable: Secondary | ICD-10-CM | POA: Diagnosis not present

## 2016-02-14 DIAGNOSIS — J45909 Unspecified asthma, uncomplicated: Secondary | ICD-10-CM | POA: Insufficient documentation

## 2016-02-14 DIAGNOSIS — Z79899 Other long term (current) drug therapy: Secondary | ICD-10-CM | POA: Insufficient documentation

## 2016-02-14 DIAGNOSIS — R51 Headache: Secondary | ICD-10-CM | POA: Diagnosis present

## 2016-02-15 ENCOUNTER — Encounter (HOSPITAL_COMMUNITY): Payer: Self-pay | Admitting: Emergency Medicine

## 2016-02-15 ENCOUNTER — Emergency Department (HOSPITAL_COMMUNITY)
Admission: EM | Admit: 2016-02-15 | Discharge: 2016-02-15 | Disposition: A | Discharge status: Home / Self Care | Payer: Medicare Other | Attending: Emergency Medicine | Admitting: Emergency Medicine

## 2016-02-15 DIAGNOSIS — F0781 Postconcussional syndrome: Secondary | ICD-10-CM

## 2016-02-15 DIAGNOSIS — G44309 Post-traumatic headache, unspecified, not intractable: Secondary | ICD-10-CM | POA: Diagnosis not present

## 2016-02-15 MED ORDER — PROCHLORPERAZINE EDISYLATE 5 MG/ML IJ SOLN
10.0000 mg | Freq: Once | INTRAMUSCULAR | Status: AC
  Administered 2016-02-15: 10 mg via INTRAMUSCULAR
  Filled 2016-02-15: qty 2

## 2016-02-15 MED ORDER — BUTALBITAL-APAP-CAFFEINE 50-325-40 MG PO TABS: 1.0000 | ORAL_TABLET | Freq: Four times a day (QID) | ORAL | 0 refills | Status: AC | PRN

## 2016-02-15 MED ORDER — DIPHENHYDRAMINE HCL 50 MG/ML IJ SOLN
25.0000 mg | Freq: Once | INTRAMUSCULAR | Status: AC
Start: 2016-02-15 — End: 2016-02-15
  Administered 2016-02-15: 25 mg via INTRAMUSCULAR
  Filled 2016-02-15: qty 1
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
## 2016-02-15 NOTE — Discharge Instructions (Signed)
Take 4 over the counter ibuprofen tablets 3 times a day or 2 over-the-counter naproxen tablets twice a day for pain. Also take tylenol 1000mg (2 extra strength) four times a day.   Only take this if you have a headache.
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
## 2016-03-06 ENCOUNTER — Ambulatory Visit: Payer: Medicare Other | Admitting: Family Medicine

## 2018-09-01 ENCOUNTER — Emergency Department: Payer: Medicare HMO

## 2018-09-01 ENCOUNTER — Other Ambulatory Visit: Payer: Self-pay

## 2018-09-01 ENCOUNTER — Encounter: Payer: Self-pay | Admitting: *Deleted

## 2018-09-01 DIAGNOSIS — Y93I9 Activity, other involving external motion: Secondary | ICD-10-CM | POA: Insufficient documentation

## 2018-09-01 DIAGNOSIS — Y999 Unspecified external cause status: Secondary | ICD-10-CM | POA: Diagnosis not present

## 2018-09-01 DIAGNOSIS — M25562 Pain in left knee: Secondary | ICD-10-CM | POA: Diagnosis present

## 2018-09-01 DIAGNOSIS — Y92410 Unspecified street and highway as the place of occurrence of the external cause: Secondary | ICD-10-CM | POA: Insufficient documentation

## 2018-09-01 DIAGNOSIS — J45909 Unspecified asthma, uncomplicated: Secondary | ICD-10-CM | POA: Insufficient documentation
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
## 2018-09-02 ENCOUNTER — Other Ambulatory Visit: Payer: Self-pay

## 2018-09-02 ENCOUNTER — Emergency Department
Admission: EM | Admit: 2018-09-02 | Discharge: 2018-09-02 | Disposition: A | Discharge status: Home / Self Care | Payer: Medicare HMO | Attending: Emergency Medicine | Admitting: Emergency Medicine

## 2018-09-02 DIAGNOSIS — M25562 Pain in left knee: Secondary | ICD-10-CM | POA: Diagnosis not present

## 2018-09-02 DIAGNOSIS — S8992XA Unspecified injury of left lower leg, initial encounter: Secondary | ICD-10-CM

## 2018-09-02 MED ORDER — HYDROCODONE-ACETAMINOPHEN 5-325 MG PO TABS
2.0000 | ORAL_TABLET | Freq: Once | ORAL | Status: AC
  Administered 2018-09-02: 2 via ORAL
  Filled 2018-09-02: qty 2

## 2018-09-02 MED ORDER — HYDROCODONE-ACETAMINOPHEN 5-325 MG PO TABS: 1.0000 | ORAL_TABLET | ORAL | 0 refills | Status: DC | PRN
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
## 2018-09-02 NOTE — ED Notes (Signed)
Patient c/o dizziness. Patient reports he had not drunk anything in hours. Patient informed that MD would have to evaluate him prior to being given PO fluids. Patient verbalized understanding. EKG ordered.

## 2018-10-25 ENCOUNTER — Ambulatory Visit: Payer: Self-pay

## 2019-06-15 ENCOUNTER — Other Ambulatory Visit: Payer: Self-pay

## 2019-06-15 DIAGNOSIS — Z792 Long term (current) use of antibiotics: Secondary | ICD-10-CM | POA: Diagnosis not present

## 2019-06-15 DIAGNOSIS — J019 Acute sinusitis, unspecified: Secondary | ICD-10-CM | POA: Diagnosis not present

## 2019-06-15 DIAGNOSIS — J45909 Unspecified asthma, uncomplicated: Secondary | ICD-10-CM | POA: Insufficient documentation

## 2019-06-15 DIAGNOSIS — R040 Epistaxis: Secondary | ICD-10-CM | POA: Diagnosis present

## 2019-06-15 DIAGNOSIS — Z79891 Long term (current) use of opiate analgesic: Secondary | ICD-10-CM | POA: Insufficient documentation

## 2019-06-15 DIAGNOSIS — Z7951 Long term (current) use of inhaled steroids: Secondary | ICD-10-CM | POA: Diagnosis not present

## 2019-06-15 DIAGNOSIS — R05 Cough: Secondary | ICD-10-CM | POA: Insufficient documentation

## 2019-06-15 NOTE — ED Triage Notes (Addendum)
Pt arrives to ED via POV from home with c/o epistaxis and nasal congestion. Pt states he was helping a friend with moving when he "kept rubbing my nose because of my allergies" and "it started to bleed and it's made my throat sore". Pt denies any c/o CP; no SHOB. No reports of N/V/D or fever. Pt is A&O, in NAD; RR even, regular, and unlabored.

## 2019-06-16 ENCOUNTER — Emergency Department: Payer: Medicare HMO

## 2019-06-16 ENCOUNTER — Emergency Department
Admission: EM | Admit: 2019-06-16 | Discharge: 2019-06-16 | Disposition: A | Discharge status: Home / Self Care | Payer: Medicare HMO | Attending: Emergency Medicine | Admitting: Emergency Medicine

## 2019-06-16 DIAGNOSIS — R040 Epistaxis: Secondary | ICD-10-CM

## 2019-06-16 DIAGNOSIS — J019 Acute sinusitis, unspecified: Secondary | ICD-10-CM

## 2019-06-16 MED ORDER — AMOXICILLIN-POT CLAVULANATE 875-125 MG PO TABS: 1.0000 | ORAL_TABLET | Freq: Two times a day (BID) | ORAL | 0 refills | Status: AC

## 2019-06-16 MED ORDER — LORATADINE 10 MG PO TABS
10.0000 mg | ORAL_TABLET | Freq: Once | ORAL | Status: AC
  Administered 2019-06-16: 10 mg via ORAL
  Filled 2019-06-16: qty 1

## 2019-06-16 MED ORDER — LORATADINE 10 MG PO TABS: 10.0000 mg | ORAL_TABLET | Freq: Every day | ORAL | 2 refills | Status: DC

## 2019-06-16 MED ORDER — AMOXICILLIN-POT CLAVULANATE 875-125 MG PO TABS
1.0000 | ORAL_TABLET | Freq: Once | ORAL | Status: AC
  Administered 2019-06-16: 1 via ORAL
  Filled 2019-06-16: qty 1

## 2019-06-16 NOTE — ED Notes (Signed)
Pt states coming in for nose bleed, chest pain and cough. Cough is dry and non productive. Pt resting in room. Pt placed on cardiac, bp and pulse ox monitor. EKG completed and given to provider for review. Pt currently does not have a nose bleed.

## 2019-06-16 NOTE — ED Provider Notes (Signed)
[facility] Emergency Department Provider Note  ____________________________________________   First MD Initiated Contact with Patient 06/16/19 [phone]     (approximate)  I have reviewed the triage vital signs and the nursing notes.   HISTORY  Chief Complaint Epistaxis  HPI Larry Ball is a 24 y.o. male    with below list of previous medical conditions presents emergency department secondary to nasal congestion nonproductive cough and an episode of epistaxis from the left nare that started spontaneously today "after rubbing my nose".  Patient states that epistaxis resolved on its own as well.  Patient denies any fever.  Patient denies any known sick contact        Past Medical History:  Diagnosis Date   Asthma    Bipolar 1 disorder ([place])     There are no problems to display for this patient.   Past Surgical History:  Procedure Laterality Date   HERNIA REPAIR     KNEE SURGERY      Prior to Admission medications   Medication Sig Start Date End Date Taking? Authorizing Provider  doxycycline (VIBRAMYCIN) 50 MG capsule Take 2 capsules (100 mg total) by mouth 2 (two) times daily. 07/09/15   Beers, Pierce Crane, PA-C  etodolac (LODINE) 200 MG capsule Take 1 capsule (200 mg total) by mouth every 8 (eight) hours. 11/26/15   Loney Hering, MD  HYDROcodone-acetaminophen (NORCO/VICODIN) 5-325 MG tablet Take 1 tablet by mouth every 4 (four) hours as needed. 09/02/18   Harvest Dark, MD  lithium 300 MG tablet Take 300 mg by mouth 3 (three) times daily.    [provider]  metoCLOPramide (REGLAN) 10 MG tablet Take 1 tablet (10 mg total) by mouth every 8 (eight) hours as needed. 11/26/15   Loney Hering, MD  oxyCODONE-acetaminophen (ROXICET) 5-325 MG tablet Take 1 tablet by mouth every 4 (four) hours as needed for severe pain. 02/12/16   Rudene Re, MD  cetirizine (ZYRTEC) 10 MG tablet Take 1 tablet (10 mg total) by mouth  daily. 04/21/15 07/09/15  Cuthriell, Charline Bills, PA-C  fluticasone (FLONASE) 50 MCG/ACT nasal spray Place 1 spray into both nostrils 2 (two) times daily. 04/21/15 07/09/15  Cuthriell, Charline Bills, PA-C    Allergies Patient has no known allergies.  No family history on file.  Social History Social History   Tobacco Use   Smoking status: Never Smoker   Smokeless tobacco: Never Used  Substance Use Topics   Alcohol use: No   Drug use: No    Review of Systems Constitutional: No fever/chills Eyes: No visual changes. ENT: No sore throat.  Positive for epistaxis Cardiovascular: Denies chest pain. Respiratory: Denies shortness of breath.  Positive for nonproductive cough Gastrointestinal: No abdominal pain.  No nausea, no vomiting.  No diarrhea.  No constipation. Genitourinary: Negative for dysuria. Musculoskeletal: Negative for neck pain.  Negative for back pain. Integumentary: Negative for rash. Neurological: Negative for headaches, focal weakness or numbness.   ____________________________________________   PHYSICAL EXAM:  VITAL SIGNS: ED Triage Vitals  Enc Vitals Group     BP 06/15/19 2329 (!) 142/94     Pulse Rate 06/15/19 2329 71     Resp 06/15/19 2329 17     Temp 06/15/19 2329 99.2 F (37.3 C)     Temp Source 06/15/19 2329 Oral     SpO2 06/15/19 2329 96 %     Weight 06/15/19 2330 97.5 kg (215 lb)     Height 06/15/19 2330 1.778 m (  5\' 10" )     Head Circumference --      Peak Flow --      Pain Score 06/15/19 2330 9     Pain Loc --      Pain Edu? --      Excl. in GC? --     Constitutional: Alert and oriented.  Eyes: Conjunctivae are normal.  Head: Atraumatic. Nose: No congestion/rhinnorhea.  Evidence of anterior epistaxis left nare no active bleeding Mouth/Throat: Patient is wearing a mask. Neck: No stridor.  No meningeal signs.   Cardiovascular: Normal rate, regular rhythm. Good peripheral circulation. Grossly normal heart sounds. Respiratory: Normal  respiratory effort.  No retractions. Gastrointestinal: Soft and nontender. No distention.  Musculoskeletal: No lower extremity tenderness nor edema. No gross deformities of extremities. Neurologic:  Normal speech and language. No gross focal neurologic deficits are appreciated.  Skin:  Skin is warm, dry and intact. Psychiatric: Mood and affect are normal. Speech and behavior are normal.  _______________________________________  RADIOLOGY I, Anderson N Waylan Busta, personally viewed and evaluated these images (plain radiographs) as part of my medical decision making, as well as reviewing the written report by the radiologist.  ED MD interpretation: Mild right basilar atelectasis on chest x-ray per radiologist  Official radiology report(s): DG Chest 1 View  Result Date: 06/16/2019 CLINICAL DATA:  Cough EXAM: CHEST  1 VIEW COMPARISON:  02/04/2011 FINDINGS: Cardiac shadow is mildly prominent but accentuated by the portable technique. The lungs are hypoinflated with mild right basilar atelectasis. No sizable effusion is seen. No bony abnormality is noted. IMPRESSION: Mild right basilar atelectasis. Electronically Signed   By: 02/06/2011 M.D.   On: 06/16/2019 03:55     Procedures   ____________________________________________   INITIAL IMPRESSION / MDM / ASSESSMENT AND PLAN / ED COURSE  As part of my medical decision making, I reviewed the following data within the electronic MEDICAL RECORD NUMBER   25 year old male presented with above-stated history and physical exam differential diagnosis including but not limited to left nare epistaxis, sinusitis bronchitis pneumonia.  X-ray revealed no evidence of pneumonia or bronchitis.  Patient was given Claritin and Augmentin in the emergency department will be prescribed the same for home  ____________________________________________  FINAL CLINICAL IMPRESSION(S) / ED DIAGNOSES  Final diagnoses:  Epistaxis  Acute sinusitis, recurrence not specified,  unspecified location     MEDICATIONS GIVEN DURING THIS VISIT:  Medications  loratadine (CLARITIN) tablet 10 mg (10 mg Oral Given 06/16/19 0341)  amoxicillin-clavulanate (AUGMENTIN) 875-125 MG per tablet 1 tablet (1 tablet Oral Given 06/16/19 0341)     ED Discharge Orders    None      *Please note:  Larry Ball was evaluated in Emergency Department on 06/16/2019 for the symptoms described in the history of present illness. He was evaluated in the context of the global COVID-19 pandemic, which necessitated consideration that the patient might be at risk for infection with the SARS-CoV-2 virus that causes COVID-19. Institutional protocols and algorithms that pertain to the evaluation of patients at risk for COVID-19 are in a state of rapid change based on information released by regulatory bodies including the CDC and federal and state organizations. These policies and algorithms were followed during the patient's care in the ED.  Some ED evaluations and interventions may be delayed as a result of limited staffing during the pandemic.*  Note:  This document was prepared using Dragon voice recognition software and may include unintentional dictation errors.   08/16/2019, MD  06/16/19 0414 ° °

## 2020-03-16 ENCOUNTER — Emergency Department
Admission: EM | Admit: 2020-03-16 | Discharge: 2020-03-16 | Disposition: A | Discharge status: Home / Self Care | Payer: Medicare Other | Attending: Emergency Medicine | Admitting: Emergency Medicine

## 2020-03-16 ENCOUNTER — Encounter: Payer: Self-pay | Admitting: Emergency Medicine

## 2020-03-16 ENCOUNTER — Other Ambulatory Visit: Payer: Self-pay

## 2020-03-16 ENCOUNTER — Emergency Department: Payer: Medicare Other

## 2020-03-16 DIAGNOSIS — S299XXA Unspecified injury of thorax, initial encounter: Secondary | ICD-10-CM | POA: Diagnosis present

## 2020-03-16 DIAGNOSIS — J45909 Unspecified asthma, uncomplicated: Secondary | ICD-10-CM | POA: Diagnosis not present

## 2020-03-16 DIAGNOSIS — S29011A Strain of muscle and tendon of front wall of thorax, initial encounter: Secondary | ICD-10-CM

## 2020-03-16 DIAGNOSIS — X500XXA Overexertion from strenuous movement or load, initial encounter: Secondary | ICD-10-CM | POA: Insufficient documentation

## 2020-03-16 DIAGNOSIS — R079 Chest pain, unspecified: Secondary | ICD-10-CM

## 2020-03-16 LAB — CBC
HCT: 40 % (ref 39.0–52.0)
Hemoglobin: 13.4 g/dL (ref 13.0–17.0)
MCH: 30.9 pg (ref 26.0–34.0)
MCHC: 33.5 g/dL (ref 30.0–36.0)
MCV: 92.2 fL (ref 80.0–100.0)
Platelets: 348 10*3/uL (ref 150–400)
RBC: 4.34 MIL/uL (ref 4.22–5.81)
RDW: 12 % (ref 11.5–15.5)
WBC: 8.4 10*3/uL (ref 4.0–10.5)
nRBC: 0 % (ref 0.0–0.2)

## 2020-03-16 LAB — BASIC METABOLIC PANEL
Anion gap: 9 (ref 5–15)
BUN: 23 mg/dL — ABNORMAL HIGH (ref 6–20)
CO2: 24 mmol/L (ref 22–32)
Calcium: 8.8 mg/dL — ABNORMAL LOW (ref 8.9–10.3)
Chloride: 104 mmol/L (ref 98–111)
Creatinine, Ser: 1 mg/dL (ref 0.61–1.24)
GFR, Estimated: >60 mL/min (ref >60)
Glucose, Bld: 103 mg/dL — ABNORMAL HIGH (ref 70–99)
Potassium: 3.9 mmol/L (ref 3.5–5.1)
Sodium: 137 mmol/L (ref 135–145)

## 2020-03-16 LAB — TROPONIN I (HIGH SENSITIVITY)
Troponin I (High Sensitivity): 4 ng/L (ref <18)
Troponin I (High Sensitivity): 4 ng/L (ref <18)

## 2020-03-16 MED ORDER — KETOROLAC TROMETHAMINE 30 MG/ML IJ SOLN: 30.0000 mg | Freq: Once | INTRAMUSCULAR | Status: DC

## 2020-03-16 MED ORDER — NAPROXEN 500 MG PO TABS
500.0000 mg | ORAL_TABLET | Freq: Once | ORAL | Status: AC
  Administered 2020-03-16: 500 mg via ORAL

## 2020-03-16 NOTE — ED Provider Notes (Signed)
[facility] Emergency Department Provider Note   ____________________________________________   Event Date/Time   First MD Initiated Contact with Patient 03/16/20 0309     (approximate)  I have reviewed the triage vital signs and the nursing notes.   HISTORY  Chief Complaint Chest Pain    HPI Larry Ball is a 25 y.o. male with past medical history of asthma and bipolar disorder who presents to the ED complaining of chest pain.  Patient states that earlier today he was lifting weights when he started to notice a pulling pain in the left side and center of his chest.  Pain has been present constantly since then and he describes it as a throbbing.  It is not exacerbated or alleviated by anything in particular but has been reproduced when he goes to press on his chest.  He denies any fevers, cough, shortness of breath, pain or swelling in his legs.  He denies any history of similar symptoms.        Past Medical History:  Diagnosis Date  . Asthma   . Bipolar 1 disorder (HCC)     There are no problems to display for this patient.   Past Surgical History:  Procedure Laterality Date  . HERNIA REPAIR    . KNEE SURGERY      Prior to Admission medications   Medication Sig Start Date End Date Taking? Authorizing Provider  doxycycline (VIBRAMYCIN) 50 MG capsule Take 2 capsules (100 mg total) by mouth 2 (two) times daily. 07/09/15   Beers, Charmayne Sheer, PA-C  etodolac (LODINE) 200 MG capsule Take 1 capsule (200 mg total) by mouth every 8 (eight) hours. 11/26/15   Rebecka Apley, MD  HYDROcodone-acetaminophen (NORCO/VICODIN) 5-325 MG tablet Take 1 tablet by mouth every 4 (four) hours as needed. 09/02/18   Minna Antis, MD  lithium 300 MG tablet Take 300 mg by mouth 3 (three) times daily.    [provider]  loratadine (CLARITIN) 10 MG tablet Take 1 tablet (10 mg total) by mouth daily. 06/16/19 06/15/20  Darci Current, MD  metoCLOPramide  (REGLAN) 10 MG tablet Take 1 tablet (10 mg total) by mouth every 8 (eight) hours as needed. 11/26/15   Rebecka Apley, MD  oxyCODONE-acetaminophen (ROXICET) 5-325 MG tablet Take 1 tablet by mouth every 4 (four) hours as needed for severe pain. 02/12/16   Nita Sickle, MD  cetirizine (ZYRTEC) 10 MG tablet Take 1 tablet (10 mg total) by mouth daily. 04/21/15 07/09/15  Cuthriell, Delorise Royals, PA-C  fluticasone (FLONASE) 50 MCG/ACT nasal spray Place 1 spray into both nostrils 2 (two) times daily. 04/21/15 07/09/15  Cuthriell, Delorise Royals, PA-C    Allergies Patient has no known allergies.  History reviewed. No pertinent family history.  Social History Social History   Tobacco Use  . Smoking status: Never Smoker  . Smokeless tobacco: Never Used  Substance Use Topics  . Alcohol use: No  . Drug use: No    Review of Systems  Constitutional: No fever/chills Eyes: No visual changes. ENT: No sore throat. Cardiovascular: Positive for chest pain. Respiratory: Denies shortness of breath. Gastrointestinal: No abdominal pain.  No nausea, no vomiting.  No diarrhea.  No constipation. Genitourinary: Negative for dysuria. Musculoskeletal: Negative for back pain. Skin: Negative for rash. Neurological: Negative for headaches, focal weakness or numbness.  ____________________________________________   PHYSICAL EXAM:  VITAL SIGNS: ED Triage Vitals [03/16/20 0029]  Enc Vitals Group     BP 139/75  Pulse Rate 66     Resp 17     Temp 98.3 F (36.8 C)     Temp Source Oral     SpO2 99 %     Weight      Height 5\' 10"  (1.778 m)     Head Circumference      Peak Flow      Pain Score      Pain Loc      Pain Edu?      Excl. in GC?     Constitutional: Alert and oriented. Eyes: Conjunctivae are normal. Head: Atraumatic. Nose: No congestion/rhinnorhea. Mouth/Throat: Mucous membranes are moist. Neck: Normal ROM Cardiovascular: Normal rate, regular rhythm. Grossly normal heart sounds.  2+  radial pulses bilaterally. Respiratory: Normal respiratory effort.  No retractions. Lungs CTAB.  Left chest wall tenderness to palpation along pectoralis muscle. Gastrointestinal: Soft and nontender. No distention. Genitourinary: deferred Musculoskeletal: No lower extremity tenderness nor edema. Neurologic:  Normal speech and language. No gross focal neurologic deficits are appreciated. Skin:  Skin is warm, dry and intact. No rash noted. Psychiatric: Mood and affect are normal. Speech and behavior are normal.  ____________________________________________   LABS (all labs ordered are listed, but only abnormal results are displayed)  Labs Reviewed  BASIC METABOLIC PANEL - Abnormal; Notable for the following components:      Result Value   Glucose, Bld 103 (*)    BUN 23 (*)    Calcium 8.8 (*)    All other components within normal limits  CBC  TROPONIN I (HIGH SENSITIVITY)  TROPONIN I (HIGH SENSITIVITY)   ____________________________________________  EKG  ED ECG REPORT I, , the attending physician, personally viewed and interpreted this ECG.   Date: 03/16/2020  EKG Time: 00:25  Rate: 65  Rhythm: normal sinus rhythm  Axis: Normal  Intervals:none  ST&T Change: None   PROCEDURES  Procedure(s) performed (including Critical Care):  Procedures   ____________________________________________   INITIAL IMPRESSION / ASSESSMENT AND PLAN / ED COURSE       25 year old male with past medical history of asthma and bipolar disorder presents to the ED with left chest wall pain ever since lifting weights earlier in the day today.  Pain is reproducible with palpation of his pectoralis muscle and symptoms seem consistent with muscle strain.  EKG shows no evidence of arrhythmia or ischemia and 2 sets of troponin are negative.  Chest x-ray reviewed by me and shows no infiltrate, edema, or effusion.  I doubt PE as he is PERC negative and symptoms are not consistent with  dissection.  Patient is appropriate for discharge home with PCP follow-up, he was counseled to rest and use ice or NSAIDs as needed.  He was counseled to return to the ED for new worsening symptoms, patient agrees with plan.      ____________________________________________   FINAL CLINICAL IMPRESSION(S) / ED DIAGNOSES  Final diagnoses:  Nonspecific chest pain  Pectoralis muscle strain, initial encounter     ED Discharge Orders    None       Note:  This document was prepared using Dragon voice recognition software and may include unintentional dictation errors.   22, MD 03/16/20 [phone]

## 2020-03-16 NOTE — ED Triage Notes (Signed)
Pt c/o left sided chest pain that started after working out today. Pt reports pain worsening over the last 4 hours and pain felt when taking deep breaths.

## 2020-07-17 ENCOUNTER — Other Ambulatory Visit: Payer: Self-pay

## 2020-07-17 ENCOUNTER — Emergency Department
Admission: EM | Admit: 2020-07-17 | Discharge: 2020-07-17 | Disposition: A | Discharge status: Home / Self Care | Payer: Medicare Other | Attending: Emergency Medicine | Admitting: Emergency Medicine

## 2020-07-17 ENCOUNTER — Emergency Department: Payer: Medicare Other

## 2020-07-17 DIAGNOSIS — W19XXXA Unspecified fall, initial encounter: Secondary | ICD-10-CM

## 2020-07-17 DIAGNOSIS — W010XXA Fall on same level from slipping, tripping and stumbling without subsequent striking against object, initial encounter: Secondary | ICD-10-CM | POA: Insufficient documentation

## 2020-07-17 DIAGNOSIS — S76912A Strain of unspecified muscles, fascia and tendons at thigh level, left thigh, initial encounter: Secondary | ICD-10-CM | POA: Diagnosis not present

## 2020-07-17 DIAGNOSIS — S79922A Unspecified injury of left thigh, initial encounter: Secondary | ICD-10-CM | POA: Diagnosis present

## 2020-07-17 DIAGNOSIS — Z7951 Long term (current) use of inhaled steroids: Secondary | ICD-10-CM | POA: Insufficient documentation

## 2020-07-17 DIAGNOSIS — J45909 Unspecified asthma, uncomplicated: Secondary | ICD-10-CM | POA: Diagnosis not present

## 2020-07-17 MED ORDER — IBUPROFEN 600 MG PO TABS
600.0000 mg | ORAL_TABLET | Freq: Once | ORAL | Status: AC
  Administered 2020-07-17: 600 mg via ORAL
  Filled 2020-07-17: qty 1

## 2020-07-17 MED ORDER — NAPROXEN 500 MG PO TABS
500.0000 mg | ORAL_TABLET | Freq: Two times a day (BID) | ORAL | 0 refills | Status: DC
Start: 2020-07-17 — End: 2021-08-12

## 2020-07-17 NOTE — ED Notes (Signed)
See triage note  Presents with pain to left upper leg/groin area  States he slipped on wet floor last pm
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
## 2020-07-17 NOTE — ED Triage Notes (Signed)
Pt c/o left leg pain , states he slipped on the wet floor and pulled something in his left inner thigh

## 2020-07-17 NOTE — ED Notes (Signed)
Pt was offered crutches but refused

## 2020-07-17 NOTE — ED Notes (Signed)
Pt refused crutches. States that he will not use them.

## 2020-07-17 NOTE — Discharge Instructions (Addendum)
Follow-up with your primary care provider or Dr. Joice Lofts who is on-call for orthopedics if any continued problems or not improving.  Ice to your muscles as needed for discomfort.  Begin taking naproxen 500 mg twice daily with food.  This is for inflammation.  You may also take Tylenol with this medication if additional pain medication is needed.  Use crutches until you are able to stand and bear weight without pain.

## 2020-09-15 ENCOUNTER — Emergency Department
Admission: EM | Admit: 2020-09-15 | Discharge: 2020-09-15 | Discharge status: Against Medical Advice | Payer: Medicare Other

## 2020-11-28 ENCOUNTER — Other Ambulatory Visit: Payer: Self-pay

## 2020-11-28 ENCOUNTER — Emergency Department
Admission: EM | Admit: 2020-11-28 | Discharge: 2020-11-28 | Disposition: A | Discharge status: Home / Self Care | Payer: Medicare Other | Attending: Emergency Medicine | Admitting: Emergency Medicine

## 2020-11-28 ENCOUNTER — Emergency Department: Payer: Medicare Other

## 2020-11-28 DIAGNOSIS — Z20822 Contact with and (suspected) exposure to covid-19: Secondary | ICD-10-CM | POA: Diagnosis not present

## 2020-11-28 DIAGNOSIS — J101 Influenza due to other identified influenza virus with other respiratory manifestations: Secondary | ICD-10-CM | POA: Diagnosis not present

## 2020-11-28 DIAGNOSIS — J111 Influenza due to unidentified influenza virus with other respiratory manifestations: Secondary | ICD-10-CM

## 2020-11-28 DIAGNOSIS — R509 Fever, unspecified: Secondary | ICD-10-CM | POA: Diagnosis present

## 2020-11-28 DIAGNOSIS — Z7951 Long term (current) use of inhaled steroids: Secondary | ICD-10-CM | POA: Diagnosis not present

## 2020-11-28 DIAGNOSIS — J45909 Unspecified asthma, uncomplicated: Secondary | ICD-10-CM | POA: Diagnosis not present

## 2020-11-28 LAB — RESP PANEL BY RT-PCR (FLU A&B, COVID) ARPGX2
Influenza A by PCR: POSITIVE — AB
Influenza B by PCR: NEGATIVE
SARS Coronavirus 2 by RT PCR: NEGATIVE

## 2020-11-28 MED ORDER — ONDANSETRON 4 MG PO TBDP
4.0000 mg | ORAL_TABLET | Freq: Once | ORAL | Status: AC
  Administered 2020-11-28: 19:00:00 4 mg via ORAL
  Filled 2020-11-28: qty 1

## 2020-11-28 MED ORDER — BENZONATATE 100 MG PO CAPS: 100.0000 mg | ORAL_CAPSULE | Freq: Four times a day (QID) | ORAL | 0 refills | Status: DC | PRN

## 2020-11-28 MED ORDER — ONDANSETRON 4 MG PO TBDP: 4.0000 mg | ORAL_TABLET | Freq: Four times a day (QID) | ORAL | 0 refills | Status: DC | PRN

## 2020-11-28 MED ORDER — ONDANSETRON 4 MG PO TBDP
ORAL_TABLET | ORAL | Status: AC
  Filled 2020-11-28: qty 1

## 2020-11-28 NOTE — ED Provider Notes (Signed)
[facility] Emergency Department Provider Note ____________________________________________   Event Date/Time   First MD Initiated Contact with Patient 11/28/20 1828     (approximate)  I have reviewed the triage vital signs and the nursing notes.  HISTORY  Chief Complaint Cough and Fever  HPI Larry Ball is a 25 y.o. male history of mood disorder.  Patient reports that he is currently not have active history of any ongoing medical issues.  Denies history of any sort of longstanding or chronic conditions (PMH denotes ashtma, but patient does not report this)  Patient reports cough starting about 3 days ago.  He reports that today has had a fever, body aches chills.  Runny nose.  Still eating well but some nausea and slight decrease in appetite  No chest pain no trouble breathing.  No abdominal pain.  Past Medical History:  Diagnosis Date   Asthma    Bipolar 1 disorder (HCC)     There are no problems to display for this patient.   Past Surgical History:  Procedure Laterality Date   HERNIA REPAIR     KNEE SURGERY      Prior to Admission medications   Medication Sig Start Date End Date Taking? Authorizing Provider  benzonatate (TESSALON PERLES) 100 MG capsule Take 1 capsule (100 mg total) by mouth every 6 (six) hours as needed for cough. 11/28/20  Yes Sharyn Creamer, MD  ondansetron (ZOFRAN ODT) 4 MG disintegrating tablet Take 1 tablet (4 mg total) by mouth every 6 (six) hours as needed for nausea or vomiting. 11/28/20  Yes Sharyn Creamer, MD  ARIPiprazole (ABILIFY) 10 MG tablet Take 10 mg by mouth daily.    [provider]  GuanFACINE HCl (INTUNIV) 3 MG TB24 Take by mouth.    [provider]  lisdexamfetamine (VYVANSE) 70 MG capsule Take 70 mg by mouth daily.    [provider]  lithium 300 MG tablet Take 300 mg by mouth 3 (three) times daily.    [provider]  loratadine (CLARITIN) 10 MG tablet Take 1  tablet (10 mg total) by mouth daily. 06/16/19 06/15/20  Darci Current, MD  naproxen (NAPROSYN) 500 MG tablet Take 1 tablet (500 mg total) by mouth 2 (two) times daily with a meal. 07/17/20   Tommi Rumps, PA-C  cetirizine (ZYRTEC) 10 MG tablet Take 1 tablet (10 mg total) by mouth daily. 04/21/15 07/09/15  Cuthriell, Delorise Royals, PA-C  fluticasone (FLONASE) 50 MCG/ACT nasal spray Place 1 spray into both nostrils 2 (two) times daily. 04/21/15 07/09/15  Cuthriell, Delorise Royals, PA-C    Allergies Patient has no known allergies.  No family history on file.  Social History Social History   Tobacco Use   Smoking status: Never   Smokeless tobacco: Never  Substance Use Topics   Alcohol use: No   Drug use: No    Review of Systems Constitutional: Fevers chills some general body aches Eyes: No visual changes. ENT: No sore throat.  Runny nose Cardiovascular: Denies chest pain. Respiratory: Denies shortness of breath.  Slight dry cough Gastrointestinal: No abdominal pain.  Decreased appetite Musculoskeletal: Negative for back pain. Skin: Negative for rash. Neurological: Negative for headaches, areas of focal weakness or numbness.   He has not had a flu vaccine ____________________________________________   PHYSICAL EXAM:  VITAL SIGNS: ED Triage Vitals  Enc Vitals Group     BP 11/28/20 1551 (!) 150/83     Pulse Rate 11/28/20 1551 93  Resp 11/28/20 1551 18     Temp 11/28/20 1551 99.4 F (37.4 C)     Temp Source 11/28/20 1551 Oral     SpO2 11/28/20 1551 95 %     Weight 11/28/20 1555 230 lb (104.3 kg)     Height 11/28/20 1554 5\' 11"  (1.803 m)     Head Circumference --      Peak Flow --      Pain Score 11/28/20 1552 10     Pain Loc --      Pain Edu? --      Excl. in GC? --     Constitutional: Alert and oriented. Well appearing and in no acute distress.  Does have some nasal congestion and appears mildly ill but in no distress.  Very nontoxic awake alert oriented  conversant. Eyes: Conjunctivae are lightly injected. Head: Atraumatic. Nose: Mild clear rhinorrhea Mouth/Throat: Mucous membranes are moist. Neck: No stridor.  Cardiovascular: Normal rate, regular rhythm. Grossly normal heart sounds.  Good peripheral circulation. Respiratory: Normal respiratory effort.  No retractions. Lungs CTAB.  Speaks full clear sentences without any distress no wheezing. Gastrointestinal: Soft and nontender. No distention. Musculoskeletal: No lower extremity tenderness nor edema. Neurologic:  Normal speech and language. No gross focal neurologic deficits are appreciated.  Skin:  Skin is warm, dry and intact. No rash noted. Psychiatric: Mood and affect are normal. Speech and behavior are normal.  ____________________________________________   LABS (all labs ordered are listed, but only abnormal results are displayed)  Labs Reviewed  RESP PANEL BY RT-PCR (FLU A&B, COVID) ARPGX2 - Abnormal; Notable for the following components:      Result Value   Influenza A by PCR POSITIVE (*)    All other components within normal limits   ____________________________________________  EKG   ____________________________________________  RADIOLOGY  DG Chest 2 View  Result Date: 11/28/2020 CLINICAL DATA:  Cough for several days EXAM: CHEST - 2 VIEW COMPARISON:  03/16/2020 FINDINGS: Cardiac shadow is within normal limits. Lungs are well aerated bilaterally. Minimal right basilar atelectatic changes are seen. No sizable effusion is noted. No bony abnormality is seen. IMPRESSION: Minimal right basilar atelectasis. Electronically Signed   By: 05/16/2020 M.D.   On: 11/28/2020 17:00    Chest x-ray reviewed negative for acute.  Minimal atelectasis ____________________________________________   PROCEDURES  Procedure(s) performed: None  Procedures  Critical Care performed: No  ____________________________________________   INITIAL IMPRESSION / ASSESSMENT AND PLAN / ED  COURSE  Pertinent labs & imaging results that were available during my care of the patient were reviewed by me and considered in my medical decision making (see chart for details).   Patient is alert well oriented nontoxic in appearance.  Reassuring hemodynamics and examination.  He has viral-like symptoms and his test is positive for influenza.  Reports approximately 3 days of symptoms worse in the last 24 hours including body aches and fevers.  Overall well appearing given diagnosis of apparent influenza.  He does not appear to have evidence of acute complication.  Appears to be low risk for developing complication.  Reports a history of asthma in his chart, but currently not treating and denies known chronic medical condition.  Symptoms present for about 3 days but worse over 1.  Discussed risk benefits of Tamiflu treated with patient and patient will forego Tamiflu treatment with this I believe is clinically appropriate.  Will give prescription for Zofran and Tessalon Perles to assist with mild nausea and cough.  Discussed with patient  careful return precautions  Of note patient's girlfriend is also with him, and present throughout interview.  His girlfriend reports she is early sometime in first trimester pregnancy and following with [facility].  He and his girlfriend report that she has not had any symptoms of influenza, but we did discuss that if she is to develop symptoms of influenza that she should return to the ER, seek medical care, or reach out to [facility] right away.  They are in agreement      ____________________________________________   FINAL CLINICAL IMPRESSION(S) / ED DIAGNOSES  Final diagnoses:  Influenza        Note:  This document was prepared using Dragon voice recognition software and may include unintentional dictation errors       Sharyn Creamer, MD 11/28/20 1923

## 2020-11-28 NOTE — ED Notes (Signed)
See triage note  presents with low grade temp and body aches

## 2020-11-28 NOTE — ED Triage Notes (Signed)
Pt states he started with a cough 3 days ago- pt states when he coughs it burns in his chest- pt states he had a fever of 102 at home- pt states last dose of tylenol 1 hour ago

## 2020-12-12 ENCOUNTER — Emergency Department
Admission: EM | Admit: 2020-12-12 | Discharge: 2020-12-12 | Disposition: A | Discharge status: Home / Self Care | Payer: Medicare Other | Attending: Emergency Medicine | Admitting: Emergency Medicine

## 2020-12-12 ENCOUNTER — Emergency Department: Payer: Medicare Other

## 2020-12-12 ENCOUNTER — Encounter: Payer: Self-pay | Admitting: Emergency Medicine

## 2020-12-12 ENCOUNTER — Other Ambulatory Visit: Payer: Self-pay

## 2020-12-12 DIAGNOSIS — J45909 Unspecified asthma, uncomplicated: Secondary | ICD-10-CM | POA: Insufficient documentation

## 2020-12-12 DIAGNOSIS — R059 Cough, unspecified: Secondary | ICD-10-CM | POA: Diagnosis present

## 2020-12-12 DIAGNOSIS — Z7952 Long term (current) use of systemic steroids: Secondary | ICD-10-CM | POA: Insufficient documentation

## 2020-12-12 DIAGNOSIS — Z20822 Contact with and (suspected) exposure to covid-19: Secondary | ICD-10-CM | POA: Insufficient documentation

## 2020-12-12 DIAGNOSIS — J40 Bronchitis, not specified as acute or chronic: Secondary | ICD-10-CM | POA: Insufficient documentation

## 2020-12-12 DIAGNOSIS — J101 Influenza due to other identified influenza virus with other respiratory manifestations: Secondary | ICD-10-CM | POA: Insufficient documentation

## 2020-12-12 LAB — RESP PANEL BY RT-PCR (FLU A&B, COVID) ARPGX2
Influenza A by PCR: NEGATIVE
Influenza B by PCR: NEGATIVE
SARS Coronavirus 2 by RT PCR: NEGATIVE

## 2020-12-12 MED ORDER — ALBUTEROL SULFATE HFA 108 (90 BASE) MCG/ACT IN AERS
2.0000 | INHALATION_SPRAY | RESPIRATORY_TRACT | 1 refills | Status: DC | PRN
Start: 2020-12-12 — End: 2023-01-11

## 2020-12-12 NOTE — ED Triage Notes (Signed)
Patient ambulatory to triage with steady gait, without difficulty or distress noted; pt reports x 2wks having congestion, prod cough clear to yellow sputum

## 2020-12-12 NOTE — ED Provider Notes (Signed)
[facility] Emergency Department Provider Note ____________________________________________   Event Date/Time   First MD Initiated Contact with Patient 12/12/20 [phone]     (approximate)  I have reviewed the triage vital signs and the nursing notes.   HISTORY  Chief Complaint Cough  HPI Larry Ball is a 25 y.o. male with history of asthma presents to the emergency department for treatment and evaluation of cough.  He had been diagnosed with influenza A but symptoms had resolved with the exception of cough.  No relief with Tessalon Perles.  He was evaluated at [facility] clinic yesterday and given azithromycin, prednisone, and Tussionex.  He states that his cough is uncontrollable.  He denies fever.         Past Medical History:  Diagnosis Date   Asthma    Bipolar 1 disorder (HCC)     There are no problems to display for this patient.   Past Surgical History:  Procedure Laterality Date   HERNIA REPAIR     KNEE SURGERY      Prior to Admission medications   Medication Sig Start Date End Date Taking? Authorizing Provider  albuterol (VENTOLIN HFA) 108 (90 Base) MCG/ACT inhaler Inhale 2 puffs into the lungs every 4 (four) hours as needed for wheezing or shortness of breath. 12/12/20  Yes Melburn Treiber B, FNP  ARIPiprazole (ABILIFY) 10 MG tablet Take 10 mg by mouth daily.    [provider]  benzonatate (TESSALON PERLES) 100 MG capsule Take 1 capsule (100 mg total) by mouth every 6 (six) hours as needed for cough. 11/28/20   Sharyn Creamer, MD  GuanFACINE HCl (INTUNIV) 3 MG TB24 Take by mouth.    [provider]  lisdexamfetamine (VYVANSE) 70 MG capsule Take 70 mg by mouth daily.    [provider]  lithium 300 MG tablet Take 300 mg by mouth 3 (three) times daily.    [provider]  loratadine (CLARITIN) 10 MG tablet Take 1 tablet (10 mg total) by mouth daily. 06/16/19 06/15/20  Darci Current, MD  naproxen  (NAPROSYN) 500 MG tablet Take 1 tablet (500 mg total) by mouth 2 (two) times daily with a meal. 07/17/20   Bridget Hartshorn L, PA-C  ondansetron (ZOFRAN ODT) 4 MG disintegrating tablet Take 1 tablet (4 mg total) by mouth every 6 (six) hours as needed for nausea or vomiting. 11/28/20   Sharyn Creamer, MD  cetirizine (ZYRTEC) 10 MG tablet Take 1 tablet (10 mg total) by mouth daily. 04/21/15 07/09/15  Cuthriell, Delorise Royals, PA-C  fluticasone (FLONASE) 50 MCG/ACT nasal spray Place 1 spray into both nostrils 2 (two) times daily. 04/21/15 07/09/15  Cuthriell, Delorise Royals, PA-C    Allergies Patient has no known allergies.  No family history on file.  Social History Social History   Tobacco Use   Smoking status: Never   Smokeless tobacco: Never  Substance Use Topics   Alcohol use: No   Drug use: No    Review of Systems  Constitutional: No fever/chills Eyes: No visual changes. ENT: No sore throat. Cardiovascular: Denies chest pain. Respiratory: Denies shortness of breath.  Positive for cough Gastrointestinal: No abdominal pain.  No nausea, no vomiting.  No diarrhea.  No constipation. Genitourinary: Negative for dysuria. Musculoskeletal: Negative for back pain. Skin: Negative for rash. Neurological: Negative for headaches, focal weakness or numbness.  ____________________________________________   PHYSICAL EXAM:  VITAL SIGNS: ED Triage Vitals  Enc Vitals Group     BP 12/12/20 0251  140/72     Pulse Rate 12/12/20 0251 64     Resp 12/12/20 0251 18     Temp 12/12/20 0251 98.3 F (36.8 C)     Temp Source 12/12/20 0251 Oral     SpO2 12/12/20 0251 97 %     Weight 12/12/20 0253 220 lb 14.4 oz (100.2 kg)     Height 12/12/20 0253 5\' 10"  (1.778 m)     Head Circumference --      Peak Flow --      Pain Score 12/12/20 0254 0     Pain Loc --      Pain Edu? --      Excl. in GC? --     Constitutional: Alert and oriented. Well appearing and in no acute distress. Eyes: Conjunctivae are normal.   Head: Atraumatic. Nose: No congestion/rhinnorhea. Mouth/Throat: Mucous membranes are moist.  Oropharynx non-erythematous. Neck: No stridor.   Hematological/Lymphatic/Immunilogical: No cervical lymphadenopathy. Cardiovascular: Normal rate, regular rhythm. Grossly normal heart sounds.  Good peripheral circulation. Respiratory: Normal respiratory effort.  No retractions. Lungs CTAB. Gastrointestinal: Soft and nontender.  Genitourinary:  Musculoskeletal: No lower extremity tenderness nor edema.  No joint effusions. Neurologic:  Normal speech and language. No gross focal neurologic deficits are appreciated. No gait instability. Skin:  Skin is warm, dry and intact. No rash noted. Psychiatric: Mood and affect are normal. Speech and behavior are normal.  ____________________________________________   LABS (all labs ordered are listed, but only abnormal results are displayed)  Labs Reviewed  RESP PANEL BY RT-PCR (FLU A&B, COVID) ARPGX2   ____________________________________________  EKG  Not indicated ____________________________________________  RADIOLOGY  ED MD interpretation:    Chest x-ray without acute concerns.  I, 14/01/22, personally viewed and evaluated these images (plain radiographs) as part of my medical decision making, as well as reviewing the written report by the radiologist.  Official radiology report(s): DG Chest 2 View  Result Date: 12/12/2020 CLINICAL DATA:  Productive cough and congestion x2 weeks. EXAM: CHEST - 2 VIEW COMPARISON:  November 28, 2020 FINDINGS: Low lung volumes are seen without evidence of acute infiltrate, pleural effusion or pneumothorax. The heart size and mediastinal contours are within normal limits. The visualized skeletal structures are unremarkable. IMPRESSION: No active cardiopulmonary disease. Electronically Signed   By: November 30, 2020 M.D.   On: 12/12/2020 03:04     ____________________________________________   PROCEDURES  Procedure(s) performed (including Critical Care):  Procedures  ____________________________________________   INITIAL IMPRESSION / ASSESSMENT AND PLAN     25 year old male presenting to the emergency department for treatment and evaluation of cough.  See HPI for further details.  While awaiting ER room assignment, he states that his cough has begun to improve.  He is already on azithromycin, prednisone, and has Tussionex.  He does have a history of asthma.  Will prescribe an inhaler to be used for persistent cough or wheezing.  He is to finish medications as previously prescribed.  He is to return to the emergency department for symptoms of change or worsen if he is unable to schedule appointment with primary care.      As part of my medical decision making, I reviewed the following data within the electronic MEDICAL RECORD NUMBER Old chart reviewed  ___________________________________________   FINAL CLINICAL IMPRESSION(S) / ED DIAGNOSES  Final diagnoses:  Bronchitis     ED Discharge Orders          Ordered    albuterol (VENTOLIN HFA) 108 (90 Base) MCG/ACT inhaler  Every 4 hours PRN        12/12/20 0726             DEAIRE MCWHIRTER was evaluated in Emergency Department on 12/12/2020 for the symptoms described in the history of present illness. He was evaluated in the context of the global COVID-19 pandemic, which necessitated consideration that the patient might be at risk for infection with the SARS-CoV-2 virus that causes COVID-19. Institutional protocols and algorithms that pertain to the evaluation of patients at risk for COVID-19 are in a state of rapid change based on information released by regulatory bodies including the CDC and federal and state organizations. These policies and algorithms were followed during the patient's care in the ED.   Note:  This document was prepared using Dragon voice  recognition software and may include unintentional dictation errors.    Chinita Pester, FNP 12/12/20 1941    Jene Every, MD 12/14/20 [phone]

## 2021-05-11 ENCOUNTER — Encounter: Payer: Self-pay | Admitting: Emergency Medicine

## 2021-05-11 ENCOUNTER — Emergency Department
Admission: EM | Admit: 2021-05-11 | Discharge: 2021-05-11 | Disposition: A | Discharge status: Home / Self Care | Payer: Medicare Other | Attending: Emergency Medicine | Admitting: Emergency Medicine

## 2021-05-11 ENCOUNTER — Other Ambulatory Visit: Payer: Self-pay

## 2021-05-11 DIAGNOSIS — K30 Functional dyspepsia: Secondary | ICD-10-CM | POA: Diagnosis not present

## 2021-05-11 DIAGNOSIS — R110 Nausea: Secondary | ICD-10-CM | POA: Diagnosis present

## 2021-05-11 DIAGNOSIS — J45909 Unspecified asthma, uncomplicated: Secondary | ICD-10-CM | POA: Insufficient documentation

## 2021-05-11 LAB — COMPREHENSIVE METABOLIC PANEL
ALT: 38 U/L (ref 0–44)
AST: 35 U/L (ref 15–41)
Albumin: 4 g/dL (ref 3.5–5.0)
Alkaline Phosphatase: 39 U/L (ref 38–126)
Anion gap: 6 (ref 5–15)
BUN: 21 mg/dL — ABNORMAL HIGH (ref 6–20)
CO2: 24 mmol/L (ref 22–32)
Calcium: 8.6 mg/dL — ABNORMAL LOW (ref 8.9–10.3)
Chloride: 104 mmol/L (ref 98–111)
Creatinine, Ser: 1.13 mg/dL (ref 0.61–1.24)
GFR, Estimated: >60 mL/min (ref >60)
Glucose, Bld: 105 mg/dL — ABNORMAL HIGH (ref 70–99)
Potassium: 3.6 mmol/L (ref 3.5–5.1)
Sodium: 134 mmol/L — ABNORMAL LOW (ref 135–145)
Total Bilirubin: 0.5 mg/dL (ref 0.3–1.2)
Total Protein: 7.4 g/dL (ref 6.5–8.1)

## 2021-05-11 LAB — URINALYSIS, ROUTINE W REFLEX MICROSCOPIC
Bilirubin Urine: NEGATIVE
Glucose, UA: NEGATIVE mg/dL
Hgb urine dipstick: NEGATIVE
Ketones, ur: NEGATIVE mg/dL
Leukocytes,Ua: NEGATIVE
Nitrite: NEGATIVE
Protein, ur: NEGATIVE mg/dL
Specific Gravity, Urine: 1.023 (ref 1.005–1.030)
pH: 7 (ref 5.0–8.0)

## 2021-05-11 LAB — CBC
HCT: 43.6 % (ref 39.0–52.0)
Hemoglobin: 14.5 g/dL (ref 13.0–17.0)
MCH: 30.1 pg (ref 26.0–34.0)
MCHC: 33.3 g/dL (ref 30.0–36.0)
MCV: 90.6 fL (ref 80.0–100.0)
Platelets: 387 10*3/uL (ref 150–400)
RBC: 4.81 MIL/uL (ref 4.22–5.81)
RDW: 11.7 % (ref 11.5–15.5)
WBC: 10.1 10*3/uL (ref 4.0–10.5)
nRBC: 0 % (ref 0.0–0.2)

## 2021-05-11 LAB — LIPASE, BLOOD: Lipase: 54 U/L — ABNORMAL HIGH (ref 11–51)

## 2021-05-11 MED ORDER — LACTATED RINGERS IV BOLUS
1000.0000 mL | Freq: Once | INTRAVENOUS | Status: AC
  Administered 2021-05-11: 1000 mL via INTRAVENOUS

## 2021-05-11 MED ORDER — ONDANSETRON HCL 4 MG/2ML IJ SOLN
4.0000 mg | Freq: Once | INTRAMUSCULAR | Status: AC
  Administered 2021-05-11: 4 mg via INTRAVENOUS
  Filled 2021-05-11: qty 2

## 2021-05-11 MED ORDER — FAMOTIDINE 20 MG PO TABS: 20.0000 mg | ORAL_TABLET | Freq: Two times a day (BID) | ORAL | 1 refills | Status: DC

## 2021-05-11 MED ORDER — ONDANSETRON 4 MG PO TBDP
4.0000 mg | ORAL_TABLET | Freq: Three times a day (TID) | ORAL | 0 refills | Status: DC | PRN
Start: 2021-05-11 — End: 2023-01-11

## 2021-05-11 MED ORDER — ONDANSETRON 4 MG PO TBDP: 4.0000 mg | ORAL_TABLET | Freq: Once | ORAL | Status: DC | PRN

## 2021-05-11 NOTE — ED Triage Notes (Signed)
Pt arrived via POV with reports of nausea after drinking orange juice 1.5 hours ago.  Pt c/o feeling weak after drinking the juice. ? ?Pt has not vomited. Pt c/o lower abd pain. ?

## 2021-05-11 NOTE — ED Provider Notes (Signed)
? ?[facility] ?Provider Note ? ? ? Event Date/Time  ? First MD Initiated Contact with Patient 05/11/21 0424   ?  (approximate) ? ? ?History  ? ?Nausea ? ? ?HPI ? ?Larry Ball is a 26 y.o. male with a history of asthma and bipolar disorder who presents for evaluation of nausea.  Patient reports that he started having nausea after drinking some orange juice that he is not sure if it was spoiled or not.  He denies any vomiting or abdominal pain, no fever or chills, no chest pain or shortness of breath.  No diarrhea, no constipation, no dysuria or hematuria ?  ? ? ?Past Medical History:  ?Diagnosis Date  ? Asthma   ? Bipolar 1 disorder (HCC)   ? ? ?Past Surgical History:  ?Procedure Laterality Date  ? HERNIA REPAIR    ? KNEE SURGERY    ? ? ? ?Physical Exam  ? ?Triage Vital Signs: ?ED Triage Vitals  ?Enc Vitals Group  ?   BP 05/11/21 0259 (!) 144/63  ?   Pulse Rate 05/11/21 0259 (!) 55  ?   Resp 05/11/21 0259 16  ?   Temp 05/11/21 0259 98.7 ?F (37.1 ?C)  ?   Temp Source 05/11/21 0259 Oral  ?   SpO2 05/11/21 0259 95 %  ?   Weight 05/11/21 0308 229 lb (103.9 kg)  ?   Height 05/11/21 0308 5\' 10"  (1.778 m)  ?   Head Circumference --   ?   Peak Flow --   ?   Pain Score 05/11/21 0256 9  ?   Pain Loc --   ?   Pain Edu? --   ?   Excl. in GC? --   ? ? ?Most recent vital signs: ?Vitals:  ? 05/11/21 0259  ?BP: (!) 144/63  ?Pulse: (!) 55  ?Resp: 16  ?Temp: 98.7 ?F (37.1 ?C)  ?SpO2: 95%  ? ? ? ?Constitutional: Alert and oriented. Well appearing and in no apparent distress. ?HEENT: ?     Head: Normocephalic and atraumatic.    ?     Eyes: Conjunctivae are normal. Sclera is non-icteric.  ?     Mouth/Throat: Mucous membranes are moist.  ?     Neck: Supple with no signs of meningismus. ?Cardiovascular: Regular rate and rhythm. No murmurs, gallops, or rubs. 2+ symmetrical distal pulses are present in all extremities.  ?Respiratory: Normal respiratory effort. Lungs are clear to auscultation bilaterally.   ?Gastrointestinal: Soft, non tender, and non distended with positive bowel sounds. No rebound or guarding. ?Genitourinary: No CVA tenderness. ?Musculoskeletal:  No edema, cyanosis, or erythema of extremities. ?Neurologic: Normal speech and language. Face is symmetric. Moving all extremities. No gross focal neurologic deficits are appreciated. ?Skin: Skin is warm, dry and intact. No rash noted. ?Psychiatric: Mood and affect are normal. Speech and behavior are normal. ? ?ED Results / Procedures / Treatments  ? ?Labs ?(all labs ordered are listed, but only abnormal results are displayed) ?Labs Reviewed  ?LIPASE, BLOOD - Abnormal; Notable for the following components:  ?    Result Value  ? Lipase 54 (*)   ? All other components within normal limits  ?COMPREHENSIVE METABOLIC PANEL - Abnormal; Notable for the following components:  ? Sodium 134 (*)   ? Glucose, Bld 105 (*)   ? BUN 21 (*)   ? Calcium 8.6 (*)   ? All other components within normal limits  ?URINALYSIS, ROUTINE W REFLEX MICROSCOPIC -  Abnormal; Notable for the following components:  ? Color, Urine YELLOW (*)   ? APPearance CLEAR (*)   ? All other components within normal limits  ?CBC  ? ? ? ?EKG ? ?none ? ? ?RADIOLOGY ?none ? ? ?PROCEDURES: ? ?Critical Care performed: No ? ?Procedures ? ? ? ?IMPRESSION / MDM / ASSESSMENT AND PLAN / ED COURSE  ?I reviewed the triage vital signs and the nursing notes. ? ? 26 y.o. male with a history of asthma and bipolar disorder who presents for evaluation of nausea after drinking orange juice.  On exam is well-appearing in no distress with normal vital signs, abdomen is soft with no tenderness throughout. ? ?Ddx: Indigestion versus food poisoning versus gas pain.  With no abdominal pain or tenderness doubt gallbladder pathology versus pancreatitis versus peptic ulcer disease versus gastritis versus appendicitis ? ? ?Plan: CBC, CMP, lipase, IV fluids, Zofran ? ? ?MEDICATIONS GIVEN IN ED: ?Medications  ?ondansetron (ZOFRAN-ODT)  disintegrating tablet 4 mg (has no administration in time range)  ?ondansetron [facility]) injection 4 mg (4 mg Intravenous Given 05/11/21 0511)  ?lactated ringers bolus 1,000 mL (1,000 mLs Intravenous New Bag/Given 05/11/21 0512)  ? ? ? ?ED COURSE: Metabolic panel with no AKI, no electrolyte derangements, normal LFTs, borderline lipase, normal white count.  UA with no signs of ketones or blood.  After IV fluids and Zofran patient reports that his symptoms have resolved.  Is tolerating p.o. with no nausea or vomiting.  Serial abdominal exams remain with no tenderness.  Discussed bland diet, increase oral hydration, and Zofran as needed at home for nausea.  Discussed follow-up with PCP and my standard return precautions. ? ? ?Consults: none ? ? ?EMR reviewed including last visit for his annual physical exam with his primary care doctor from January 2023 ? ? ? ?FINAL CLINICAL IMPRESSION(S) / ED DIAGNOSES  ? ?Final diagnoses:  ?Nausea  ?Indigestion  ? ? ? ?Rx / DC Orders  ? ?ED Discharge Orders   ? ?      Ordered  ?  ondansetron (ZOFRAN-ODT) 4 MG disintegrating tablet  Every 8 hours PRN       ? 05/11/21 0606  ?  famotidine (PEPCID) 20 MG tablet  2 times daily       ? 05/11/21 0606  ? ?  ?  ? ?  ? ? ? ?Note:  This document was prepared using Dragon voice recognition software and may include unintentional dictation errors. ? ? ?Please note:  Patient was evaluated in Emergency Department today for the symptoms described in the history of present illness. Patient was evaluated in the context of the global COVID-19 pandemic, which necessitated consideration that the patient might be at risk for infection with the SARS-CoV-2 virus that causes COVID-19. Institutional protocols and algorithms that pertain to the evaluation of patients at risk for COVID-19 are in a state of rapid change based on information released by regulatory bodies including the CDC and federal and state organizations. These policies and algorithms were  followed during the patient's care in the ED.  Some ED evaluations and interventions may be delayed as a result of limited staffing during the pandemic. ? ? ? ? ?  ?Nita Sickle, MD ?05/11/21 [phone] ? ?

## 2021-05-11 NOTE — ED Notes (Signed)
Pt express discomfort from IV, IV did not have any discoloration or swelling to the skin. RN was notified. ?

## 2021-08-12 ENCOUNTER — Emergency Department: Payer: Medicare Other

## 2021-08-12 ENCOUNTER — Encounter: Payer: Self-pay | Admitting: Emergency Medicine

## 2021-08-12 ENCOUNTER — Other Ambulatory Visit: Payer: Self-pay

## 2021-08-12 ENCOUNTER — Emergency Department
Admission: EM | Admit: 2021-08-12 | Discharge: 2021-08-12 | Disposition: A | Discharge status: Home / Self Care | Payer: Medicare Other | Attending: Emergency Medicine | Admitting: Emergency Medicine

## 2021-08-12 DIAGNOSIS — S86911A Strain of unspecified muscle(s) and tendon(s) at lower leg level, right leg, initial encounter: Secondary | ICD-10-CM | POA: Diagnosis not present

## 2021-08-12 DIAGNOSIS — J45909 Unspecified asthma, uncomplicated: Secondary | ICD-10-CM | POA: Insufficient documentation

## 2021-08-12 DIAGNOSIS — W228XXA Striking against or struck by other objects, initial encounter: Secondary | ICD-10-CM | POA: Insufficient documentation

## 2021-08-12 DIAGNOSIS — Y9389 Activity, other specified: Secondary | ICD-10-CM | POA: Insufficient documentation

## 2021-08-12 DIAGNOSIS — S8991XA Unspecified injury of right lower leg, initial encounter: Secondary | ICD-10-CM | POA: Diagnosis present

## 2021-08-12 MED ORDER — MELOXICAM 15 MG PO TABS: 15.0000 mg | ORAL_TABLET | Freq: Every day | ORAL | 0 refills | Status: DC

## 2021-08-12 NOTE — Discharge Instructions (Signed)
Please wear your brace anytime you are out of bed.  Take your medication daily as prescribed.  Follow-up with orthopedics if not improving over the week.

## 2021-08-12 NOTE — ED Notes (Signed)
Pt A&O, pt given discharge instructions, pt ambulating with steady gait. 

## 2021-08-12 NOTE — ED Provider Notes (Signed)
[facility] Provider Note    Event Date/Time   First MD Initiated Contact with Patient 08/12/21 1724     (approximate)   History   Knee Pain   HPI  Larry Ball is a 26 y.o. male with history of ACL repair presents to the emergency department for treatment and evaluation of right knee pain.  Patient states that today while stepping off a curb his knee gave out.  He has had a "rubbing" sensation since this happened. Pain increases with ambulation.   Past Medical History:  Diagnosis Date   Asthma    Bipolar 1 disorder [facility])      Physical Exam   Triage Vital Signs: ED Triage Vitals  Enc Vitals Group     BP 08/12/21 1542 (!) 148/71     Pulse Rate 08/12/21 1542 64     Resp 08/12/21 1542 16     Temp 08/12/21 1542 98 F (36.7 C)     Temp Source 08/12/21 1542 Oral     SpO2 08/12/21 1542 96 %     Weight 08/12/21 1546 225 lb (102.1 kg)     Height 08/12/21 1546 5\' 10"  (1.778 m)     Head Circumference --      Peak Flow --      Pain Score 08/12/21 1546 10     Pain Loc --      Pain Edu? --      Excl. in GC? --     Most recent vital signs: Vitals:   08/12/21 1542 08/12/21 1805  BP: (!) 148/71 114/82  Pulse: 64 (!) 56  Resp: 16 18  Temp: 98 F (36.7 C) 98.2 F (36.8 C)  SpO2: 96% 96%    General: Awake, no distress.  CV:  Good peripheral perfusion.  Resp:  Normal effort.  Abd:  No distention.  Other:  Right knee stable on exam.  Mild diffuse edema.   ED Results / Procedures / Treatments   Labs (all labs ordered are listed, but only abnormal results are displayed) Labs Reviewed - No data to display   EKG     RADIOLOGY  Image of the right knee negative for acute concerns.  I have independently reviewed and interpreted imaging as well as reviewed report from radiology.  PROCEDURES:  Critical Care performed: No  Procedures   MEDICATIONS ORDERED IN ED:  Medications - No data to display   IMPRESSION / MDM /  ASSESSMENT AND PLAN / ED COURSE   I reviewed the triage vital signs and the nursing notes.  Differential diagnosis includes, but is not limited to: Knee sprain, knee strain, meniscus injury  Patient's presentation is most consistent with acute complicated illness / injury requiring diagnostic workup.  26 year old male presenting to the emergency department for evaluation of right knee pain that started this afternoon.  See HPI for further details.  On exam his knee is stable.  X-ray is negative for acute concerns.  Discussed results with the patient and offered knee brace.  He states that he has a knee brace at home that he will apply.  Offered crutches to which she declined.  He will be discharged home with a prescription for meloxicam and advised to wear his knee brace when out of bed for the next few days.  He was also encouraged to rest, ice, and elevate.  Work excuse for the next couple days provided.  He is to follow-up with orthopedics if not improving over the  week.      FINAL CLINICAL IMPRESSION(S) / ED DIAGNOSES   Final diagnoses:  Knee strain, right, initial encounter     Rx / DC Orders   ED Discharge Orders          Ordered    meloxicam (MOBIC) 15 MG tablet  Daily        08/12/21 1739             Note:  This document was prepared using Dragon voice recognition software and may include unintentional dictation errors.   Chinita Pester, FNP 08/12/21 6226    Minna Antis, MD 08/13/21 2328

## 2021-08-12 NOTE — ED Triage Notes (Signed)
Patient c/o right knee pain onset of today while stepping off a curb. Patient ambulatory to triage. Feels like a rubbing sensation in his knee.

## 2021-08-13 ENCOUNTER — Emergency Department
Admission: EM | Admit: 2021-08-13 | Discharge: 2021-08-14 | Disposition: A | Discharge status: Home / Self Care | Payer: Medicare Other | Attending: Emergency Medicine | Admitting: Emergency Medicine

## 2021-08-13 ENCOUNTER — Encounter: Payer: Self-pay | Admitting: Radiology

## 2021-08-13 ENCOUNTER — Emergency Department: Payer: Medicare Other

## 2021-08-13 ENCOUNTER — Other Ambulatory Visit: Payer: Self-pay

## 2021-08-13 DIAGNOSIS — S40022A Contusion of left upper arm, initial encounter: Secondary | ICD-10-CM | POA: Insufficient documentation

## 2021-08-13 DIAGNOSIS — Y92410 Unspecified street and highway as the place of occurrence of the external cause: Secondary | ICD-10-CM | POA: Insufficient documentation

## 2021-08-13 DIAGNOSIS — S4992XA Unspecified injury of left shoulder and upper arm, initial encounter: Secondary | ICD-10-CM | POA: Diagnosis present

## 2021-08-13 MED ORDER — HYDROCODONE-ACETAMINOPHEN 5-325 MG PO TABS
1.0000 | ORAL_TABLET | ORAL | Status: AC
  Administered 2021-08-13: 1 via ORAL
  Filled 2021-08-13: qty 1

## 2021-08-13 MED ORDER — MELOXICAM 15 MG PO TABS: 15.0000 mg | ORAL_TABLET | Freq: Every day | ORAL | 0 refills | Status: DC

## 2021-08-13 MED ORDER — HYDROCODONE-ACETAMINOPHEN 5-325 MG PO TABS: 1.0000 | ORAL_TABLET | ORAL | 0 refills | Status: DC | PRN
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
## 2021-08-13 NOTE — Discharge Instructions (Addendum)
Please use sling as needed for comfort.  Take Tylenol and meloxicam as needed for mild to moderate pain.  You may use Norco for severe pain.  Apply ice 20 minutes every hour to left upper extremity.  If no improvement in pain in the next few days would recommend calling to schedule follow-up appointment with orthopedics.  Return to the ER for any severe increasing pain, swelling, numbness or tingling in the upper extremity
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
## 2021-11-10 ENCOUNTER — Other Ambulatory Visit: Payer: Self-pay

## 2021-11-10 ENCOUNTER — Emergency Department
Admission: EM | Admit: 2021-11-10 | Discharge: 2021-11-10 | Disposition: A | Discharge status: Home / Self Care | Payer: Medicare Other | Attending: Emergency Medicine | Admitting: Emergency Medicine

## 2021-11-10 ENCOUNTER — Encounter: Payer: Self-pay | Admitting: Emergency Medicine

## 2021-11-10 DIAGNOSIS — B349 Viral infection, unspecified: Secondary | ICD-10-CM | POA: Diagnosis not present

## 2021-11-10 DIAGNOSIS — Z20822 Contact with and (suspected) exposure to covid-19: Secondary | ICD-10-CM | POA: Insufficient documentation

## 2021-11-10 DIAGNOSIS — R590 Localized enlarged lymph nodes: Secondary | ICD-10-CM | POA: Insufficient documentation

## 2021-11-10 DIAGNOSIS — J45909 Unspecified asthma, uncomplicated: Secondary | ICD-10-CM | POA: Insufficient documentation

## 2021-11-10 DIAGNOSIS — R509 Fever, unspecified: Secondary | ICD-10-CM | POA: Diagnosis present

## 2021-11-10 LAB — RESP PANEL BY RT-PCR (FLU A&B, COVID) ARPGX2
Influenza A by PCR: NEGATIVE
Influenza B by PCR: NEGATIVE
SARS Coronavirus 2 by RT PCR: NEGATIVE

## 2021-11-10 LAB — MONONUCLEOSIS SCREEN: Mono Screen: NEGATIVE

## 2021-11-10 LAB — GROUP A STREP BY PCR: Group A Strep by PCR: NOT DETECTED

## 2021-11-10 MED ORDER — MELOXICAM 15 MG PO TABS: 15.0000 mg | ORAL_TABLET | Freq: Every day | ORAL | 0 refills | Status: AC

## 2021-11-10 MED ORDER — CEPACOL REGULAR STRENGTH 3 MG MT LOZG: 1.0000 | LOZENGE | OROMUCOSAL | 12 refills | Status: DC | PRN

## 2021-11-10 NOTE — ED Triage Notes (Signed)
Patient states he woke with sore throat, headache, fever and watering eyes. Reports, "The last time I felt this way I had COVID." States he did not have thermometer to measure temperature at home. AOX4. Ambulatory. Resp even, unlabored on RA.

## 2021-11-10 NOTE — ED Provider Notes (Signed)
[facility] Provider Note    Event Date/Time   First MD Initiated Contact with Patient 11/10/21 [phone]     (approximate)   History   Chief Complaint Sore Throat   HPI Larry Ball is a 26 y.o. male, history of asthma, bipolar 1 disorder, presents emergency part for evaluation of sore throat.  Patient states he woke up with sore throat, headache, fever, and watery eyes.  Denies being around anybody sick recently.  Denies any recent vaccinations.  Denies chest pain, shortness of breath, neck pain, abdominal pain, flank pain, nausea/vomiting, dysuria, diarrhea, hematuria, or hematochezia.  History Limitations: No limitations.        Physical Exam  Triage Vital Signs: ED Triage Vitals  Enc Vitals Group     BP 11/10/21 0626 137/83     Pulse Rate 11/10/21 0624 (!) 58     Resp 11/10/21 0624 20     Temp 11/10/21 0624 98.2 F (36.8 C)     Temp src --      SpO2 11/10/21 0624 96 %     Weight 11/10/21 0626 231 lb (104.8 kg)     Height 11/10/21 0625 5\' 10"  (1.778 m)     Head Circumference --      Peak Flow --      Pain Score 11/10/21 0624 9     Pain Loc --      Pain Edu? --      Excl. in GC? --     Most recent vital signs: Vitals:   11/10/21 0624 11/10/21 0626  BP:  137/83  Pulse: (!) 58   Resp: 20   Temp: 98.2 F (36.8 C)   SpO2: 96%     General: Awake, appears exhausted. Skin: Warm, dry. No rashes or lesions.  Eyes: PERRL. Conjunctivae normal.  CV: Good peripheral perfusion.  Resp: Normal effort.  Lung sounds are clear bilaterally in the apices and bases. Abd: Soft, non-tender. No distention.  Neuro: At baseline. No gross neurological deficits.  Musculoskeletal: Normal ROM of all extremities.  Focused Exam: Subtle posterior lymphadenopathy present bilaterally.  Mild erythema in the posterior pharynx, no tonsillar swelling or exudates.  Uvula midline.  Physical Exam    ED Results / Procedures / Treatments  Labs (all labs  ordered are listed, but only abnormal results are displayed) Labs Reviewed  RESP PANEL BY RT-PCR (FLU A&B, COVID) [id number]  GROUP A STREP BY PCR  MONONUCLEOSIS SCREEN     EKG N/A.    RADIOLOGY  ED Provider Interpretation: N/A.  No results found.  PROCEDURES:  Critical Care performed: N/A.  Procedures    MEDICATIONS ORDERED IN ED: Medications - No data to display   IMPRESSION / MDM / ASSESSMENT AND PLAN / ED COURSE  I reviewed the triage vital signs and the nursing notes.                              Differential diagnosis includes, but is not limited to, COVID-19, influenza, group A strep, mononucleosis, viral URI, allergic rhinitis.   Assessment/Plan Patient presents with sore throat and body aches since this morning.  He appears clinically well.  Suspicion for any serious or life-threatening pathology.  Respiratory panel shows no evidence of COVID-19 or influenza.  Group A strep PCR negative.  No evidence of peritonsillar or retropharyngeal abscesses.  Given the posterior lymphadenopathy and chief complaint of body aches, high suspicion for mononucleosis.  Pending test at this time, though it is unlikely to change management.  We will provide him with prescription for meloxicam to help with the body aches.  Encouraged him to add acetaminophen as needed.  We will additionally provide him with a cepacol lozenges.  Recommend they follow-up with his primary care provider as needed.  He was agreeable to this plan.  Will discharge.  Provided the patient with anticipatory guidance, return precautions, and educational material. Encouraged the patient to return to the emergency department at any time if they begin to experience any new or worsening symptoms. Patient expressed understanding and agreed with the plan.   Patient's presentation is most consistent with acute complicated illness / injury requiring diagnostic workup.       FINAL CLINICAL IMPRESSION(S) / ED DIAGNOSES    Final diagnoses:  Viral syndrome     Rx / DC Orders   ED Discharge Orders          Ordered    meloxicam (MOBIC) 15 MG tablet  Daily        11/10/21 0840    menthol-cetylpyridinium (CEPACOL REGULAR STRENGTH) 3 MG lozenge  As needed        11/10/21 0840             Note:  This document was prepared using Dragon voice recognition software and may include unintentional dictation errors.   Teodoro Spray, [employer] 11/10/21 0840    Harvest Dark, MD 11/10/21 [phone]

## 2021-11-10 NOTE — Discharge Instructions (Addendum)
-  If testing does not show any evidence of strep throat, COVID-19, or influenza.  I suspect you may have a condition called mononucleosis.  Please review the educational material.  You will be notified of your test results likely by the end of the day.  If not mono, I suspect you may have some other viral illness that will likely resolve on its own.  You may manage her symptoms with the medications provided.   -You may take the meloxicam as needed for the body aches/fever.  You may additionally take acetaminophen as needed.  -You may utilize the Cepacol lozenges as needed for sore throat.  -Follow-up with your primary care provider as needed.

## 2021-12-03 ENCOUNTER — Encounter: Payer: Self-pay | Admitting: *Deleted

## 2021-12-03 ENCOUNTER — Other Ambulatory Visit: Payer: Self-pay

## 2021-12-03 ENCOUNTER — Emergency Department
Admission: EM | Admit: 2021-12-03 | Discharge: 2021-12-03 | Disposition: A | Discharge status: Home / Self Care | Payer: Medicare Other | Attending: Student in an Organized Health Care Education/Training Program | Admitting: Student in an Organized Health Care Education/Training Program

## 2021-12-03 DIAGNOSIS — S0990XA Unspecified injury of head, initial encounter: Secondary | ICD-10-CM | POA: Insufficient documentation

## 2021-12-03 DIAGNOSIS — Y92410 Unspecified street and highway as the place of occurrence of the external cause: Secondary | ICD-10-CM | POA: Insufficient documentation

## 2021-12-03 MED ORDER — ACETAMINOPHEN 500 MG PO TABS
1000.0000 mg | ORAL_TABLET | Freq: Once | ORAL | Status: AC
  Administered 2021-12-03: 1000 mg via ORAL
  Filled 2021-12-03: qty 2

## 2021-12-03 NOTE — ED Notes (Signed)
Pt attempted for sign for d/c paperwork but topaz states "unplugged" though all this RN can see is plugged in.
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
## 2023-01-05 ENCOUNTER — Encounter: Payer: Self-pay | Admitting: Emergency Medicine

## 2023-01-05 ENCOUNTER — Other Ambulatory Visit: Payer: Self-pay

## 2023-01-05 ENCOUNTER — Emergency Department
Admission: EM | Admit: 2023-01-05 | Discharge: 2023-01-07 | Disposition: A | Discharge status: Other Acute Inpatient Hospital | Payer: 59 | Source: Home / Self Care | Attending: Emergency Medicine | Admitting: Emergency Medicine

## 2023-01-05 DIAGNOSIS — T50902A Poisoning by unspecified drugs, medicaments and biological substances, intentional self-harm, initial encounter: Secondary | ICD-10-CM | POA: Insufficient documentation

## 2023-01-05 DIAGNOSIS — F312 Bipolar disorder, current episode manic severe with psychotic features: Secondary | ICD-10-CM | POA: Diagnosis not present

## 2023-01-05 DIAGNOSIS — Z79899 Other long term (current) drug therapy: Secondary | ICD-10-CM | POA: Insufficient documentation

## 2023-01-05 DIAGNOSIS — F3130 Bipolar disorder, current episode depressed, mild or moderate severity, unspecified: Secondary | ICD-10-CM | POA: Insufficient documentation

## 2023-01-05 DIAGNOSIS — F32A Depression, unspecified: Secondary | ICD-10-CM | POA: Diagnosis not present

## 2023-01-05 DIAGNOSIS — F419 Anxiety disorder, unspecified: Secondary | ICD-10-CM | POA: Diagnosis not present

## 2023-01-05 LAB — ACETAMINOPHEN LEVEL: Acetaminophen (Tylenol), Serum: <10 ug/mL — ABNORMAL LOW (ref 10–30)

## 2023-01-05 LAB — SALICYLATE LEVEL: Salicylate Lvl: <7 mg/dL — ABNORMAL LOW (ref 7.0–30.0)

## 2023-01-05 LAB — COMPREHENSIVE METABOLIC PANEL
ALT: 31 U/L (ref 0–44)
AST: 24 U/L (ref 15–41)
Albumin: 3.5 g/dL (ref 3.5–5.0)
Alkaline Phosphatase: 36 U/L — ABNORMAL LOW (ref 38–126)
Anion gap: 9 (ref 5–15)
BUN: 12 mg/dL (ref 6–20)
CO2: 27 mmol/L (ref 22–32)
Calcium: 8.5 mg/dL — ABNORMAL LOW (ref 8.9–10.3)
Chloride: 101 mmol/L (ref 98–111)
Creatinine, Ser: 1.57 mg/dL — ABNORMAL HIGH (ref 0.61–1.24)
GFR, Estimated: >60 mL/min (ref >60)
Glucose, Bld: 89 mg/dL (ref 70–99)
Potassium: 3.5 mmol/L (ref 3.5–5.1)
Sodium: 137 mmol/L (ref 135–145)
Total Bilirubin: 0.6 mg/dL (ref <1.2)
Total Protein: 7.3 g/dL (ref 6.5–8.1)

## 2023-01-05 LAB — CBC WITH DIFFERENTIAL/PLATELET
Abs Immature Granulocytes: 0.01 10*3/uL (ref 0.00–0.07)
Basophils Absolute: 0 10*3/uL (ref 0.0–0.1)
Basophils Relative: 0 %
Eosinophils Absolute: 0.1 10*3/uL (ref 0.0–0.5)
Eosinophils Relative: 1 %
HCT: 44.2 % (ref 39.0–52.0)
Hemoglobin: 14.8 g/dL (ref 13.0–17.0)
Immature Granulocytes: 0 %
Lymphocytes Relative: 25 %
Lymphs Abs: 2.1 10*3/uL (ref 0.7–4.0)
MCH: 30.4 pg (ref 26.0–34.0)
MCHC: 33.5 g/dL (ref 30.0–36.0)
MCV: 90.8 fL (ref 80.0–100.0)
Monocytes Absolute: 0.6 10*3/uL (ref 0.1–1.0)
Monocytes Relative: 8 %
Neutro Abs: 5.4 10*3/uL (ref 1.7–7.7)
Neutrophils Relative %: 66 %
Platelets: 390 10*3/uL (ref 150–400)
RBC: 4.87 MIL/uL (ref 4.22–5.81)
RDW: 13.2 % (ref 11.5–15.5)
WBC: 8.2 10*3/uL (ref 4.0–10.5)
nRBC: 0 % (ref 0.0–0.2)

## 2023-01-05 LAB — LIPASE, BLOOD: Lipase: 33 U/L (ref 11–51)

## 2023-01-05 MED ORDER — SODIUM CHLORIDE 0.9 % IV BOLUS
1000.0000 mL | Freq: Once | INTRAVENOUS | Status: AC
Start: 2023-01-05 — End: 2023-01-06
  Administered 2023-01-05: 1000 mL via INTRAVENOUS

## 2023-01-05 MED ORDER — IBUPROFEN 600 MG PO TABS
600.0000 mg | ORAL_TABLET | Freq: Three times a day (TID) | ORAL | Status: DC | PRN
  Administered 2023-01-07: 600 mg via ORAL
  Filled 2023-01-05: qty 1

## 2023-01-05 MED ORDER — ONDANSETRON HCL 4 MG PO TABS
4.0000 mg | ORAL_TABLET | Freq: Three times a day (TID) | ORAL | Status: DC | PRN
Start: 2023-01-05 — End: 2023-01-07
  Administered 2023-01-07: 4 mg via ORAL
  Filled 2023-01-05: qty 1

## 2023-01-05 MED ORDER — ALUM & MAG HYDROXIDE-SIMETH 200-200-20 MG/5ML PO SUSP: 30.0000 mL | Freq: Four times a day (QID) | ORAL | Status: DC | PRN

## 2023-01-05 NOTE — ED Triage Notes (Signed)
Patient presents via acems with c/o intentional overdose. EMS reports was unable to find actual pill bottle, but patient reports they are prescribed for "my attitude". EMS reports tablets were "football shaped" and 5mg . When this RN asked if Abilify sounded familiar patient reports, "yeah I think that's it". He reports he took these pills in an attempt to kill himself. He reports this is first attempt- reports a lot of stressors at home and with family. He reports following up with an outpatient provider in [place]. Denies HI/AH/VH.
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
## 2023-01-05 NOTE — ED Provider Notes (Signed)
[facility] Provider Note    Event Date/Time   First MD Initiated Contact with Patient 01/05/23 2145     (approximate)   History   Chief Complaint: Medication overdose  HPI  Larry Ball is a 27 y.o. male with a past history of bipolar disorder on Abilify and Latuda in the past who comes to the ED due to an intentional medication overdose at about 7 PM tonight.  Patient reports that he is feeling stressed and overwhelmed and was trying to kill himself.  No hallucinations or HI.          Physical Exam   Triage Vital Signs: ED Triage Vitals  Encounter Vitals Group     BP      Systolic BP Percentile      Diastolic BP Percentile      Pulse      Resp      Temp      Temp src      SpO2      Weight      Height      Head Circumference      Peak Flow      Pain Score      Pain Loc      Pain Education      Exclude from Growth Chart     Most recent vital signs: Vitals:   01/05/23 2200  BP: (!) 141/75  Pulse: 86  Resp: 17  Temp: 98 F (36.7 C)  SpO2: 96%    General: Awake, no distress.  CV:  Good peripheral perfusion.  Regular rate rhythm Resp:  Normal effort.  Clear to auscultation bilaterally Abd:  No distention.  Soft nontender Other:  No nystagmus.  Oriented.   ED Results / Procedures / Treatments   Labs (all labs ordered are listed, but only abnormal results are displayed) Labs Reviewed  ACETAMINOPHEN LEVEL - Abnormal; Notable for the following components:      Result Value   Acetaminophen (Tylenol), Serum <10 (*)    All other components within normal limits  COMPREHENSIVE METABOLIC PANEL - Abnormal; Notable for the following components:   Creatinine, Ser 1.57 (*)    Calcium 8.5 (*)    Alkaline Phosphatase 36 (*)    All other components within normal limits  SALICYLATE LEVEL - Abnormal; Notable for the following components:   Salicylate Lvl <7.0 (*)    All other components within normal limits  LIPASE, BLOOD   CBC WITH DIFFERENTIAL/PLATELET  ETHANOL  URINE DRUG SCREEN, QUALITATIVE ([facility] ONLY)     EKG Interpreted by me Sinus rhythm rate of 88.  Right axis, normal intervals.  Normal QRS and ST segments.  No ischemic changes.  No evidence of cardiac toxicity   RADIOLOGY    PROCEDURES:  Procedures   MEDICATIONS ORDERED IN ED: Medications  ibuprofen (ADVIL) tablet 600 mg (has no administration in time range)  ondansetron (ZOFRAN) tablet 4 mg (has no administration in time range)  alum & mag hydroxide-simeth (MAALOX/MYLANTA) 200-200-20 MG/5ML suspension 30 mL (has no administration in time range)  sodium chloride 0.9 % bolus 1,000 mL (1,000 mLs Intravenous New Bag/Given 01/05/23 2206)     IMPRESSION / MDM / ASSESSMENT AND PLAN / ED COURSE  I reviewed the triage vital signs and the nursing notes.  DDx: AKI, electrolyte derangement, Abilify toxicity, coingestion  Patient's presentation is most consistent with acute presentation with potential threat to life or bodily function.  Patient presents with intentional medication  overdose and suicide attempt.  Vital signs unremarkable, exam reassuring.  Will observe in ED, IVC, consult psychiatry.  Poison Control Center advises 10-hour observation.  (Until 6 AM).  Currently remained stable.       FINAL CLINICAL IMPRESSION(S) / ED DIAGNOSES   Final diagnoses:  Intentional drug overdose, initial encounter [facility])     Rx / DC Orders   ED Discharge Orders     None        Note:  This document was prepared using Dragon voice recognition software and may include unintentional dictation errors.   Sharman Cheek, MD 01/05/23 2253

## 2023-01-06 DIAGNOSIS — F419 Anxiety disorder, unspecified: Secondary | ICD-10-CM | POA: Diagnosis not present

## 2023-01-06 DIAGNOSIS — F32A Depression, unspecified: Secondary | ICD-10-CM

## 2023-01-06 LAB — URINE DRUG SCREEN, QUALITATIVE (ARMC ONLY)
Amphetamines, Ur Screen: NOT DETECTED
Barbiturates, Ur Screen: NOT DETECTED
Benzodiazepine, Ur Scrn: NOT DETECTED
Cannabinoid 50 Ng, Ur ~~LOC~~: NOT DETECTED
Cocaine Metabolite,Ur ~~LOC~~: NOT DETECTED
MDMA (Ecstasy)Ur Screen: NOT DETECTED
Methadone Scn, Ur: NOT DETECTED
Opiate, Ur Screen: NOT DETECTED
Phencyclidine (PCP) Ur S: NOT DETECTED
Tricyclic, Ur Screen: NOT DETECTED

## 2023-01-06 LAB — ACETAMINOPHEN LEVEL: Acetaminophen (Tylenol), Serum: <10 ug/mL — ABNORMAL LOW (ref 10–30)

## 2023-01-06 LAB — SALICYLATE LEVEL: Salicylate Lvl: <7 mg/dL — ABNORMAL LOW (ref 7.0–30.0)

## 2023-01-06 LAB — ETHANOL: Alcohol, Ethyl (B): <10 mg/dL (ref <10)

## 2023-01-06 NOTE — Consult Note (Addendum)
[facility] Consult Note  Patient Name: Larry Ball MRN: [id number] DOB: 09/24/1995 DATE OF Consult: 01/06/2023  PRIMARY PSYCHIATRIC DIAGNOSES Unspecified depressive disorder; Unspecified anxiety disorder  Based on my current evaluation and assessment of the patient, he is a 27 y.o. male with suicide attempt via intentional ingestion. Per collateral gathered by primary team from significant other, patient has had suicidal thoughts leading into this attempt. There is significant concern that patient is minimizing the severity of his symptoms given the marked discrepancies between his history and that collected and/or documented in the chart: patient denies that significant other was engaged in his treatment during this observation despite documentation indicating the contrary; his answers vacillate between negative and affirmative which was seen when patient refused to engage in therapy and then quickly amended his response; and then collateral from mother indicates significant psychosocial stressors from significant other and daughter who was being evaluated in the hospital recently which patient did not even mention. Patient is unable to contract for safety; there is significant concern that patient is at high risk to self and others at this time. The patient's presentation is consistent with Unspecified depressive disorder; Unspecified anxiety disorder. Therefore, patient does meet criteria for an intensive inpatient psychiatric hospitalization.  RECOMMENDATIONS  Medication recommendations:  Risks, benefits, side effects and alternatives to treatments reviewed:  -Continue home psychotropic regimen (recommend performing a medication reconciliation with patient's pharmacy to ascertain accuracy of reported regimen)  As needed medications to manage patient's acute symptoms while in hospital care: QTc is 401 ms as of 12/2022 -Maximize utilization of verbal de-escalation techniques, if  attempts are unsuccessful and patient poses a threat to self and others: Consider olanzapine (Zyprexa) 5 mg to 10 mg PO/IM with diphenhydramine 25 mg to 50 mg PO/IM every 6 hours as needed for severe agitation. Would offer patient the option of taking PO medication first, but if patient refuses then may administer IM medication as a last resort. Would not exceed 30 mg of olanzapine within a 24-hour period. Avoid co-administering intramuscular olanzapine with intravenous benzodiazepine, as giving both medications concurrently is associated with respiratory depression.   Non-Medication recommendations:  Note: Please stop all antipsychotic and QTc prolonging medications if patient's QTc is greater than 480 ms (except for aripiprazole, which demonstrates reduction of QTc per case reports and literature review). Of note, to decrease the risk of prolonged QTc, please maintain potassium and magnesium levels within normal ranges. -Agree with work up for organic causes of altered mentation and mood dysregulation, consider the following if not already performed: CT of the head, CBC and differential, basic metabolic profile, liver function tests (if abnormal consider ammonia level), urinalysis, urine toxicology screen, vitamin B12 level, vitamin D level, TSH with reflex free T4  Observation recommendations:  per unit protocol for management of suicidal  patient that is an elopement risk  Recommendations: Is inpatient psychiatric hospitalization recommended for this patient? Yes (Explain why): patient is at high risk to self given suicide attempt  Follow-Up Telepsychiatry C/L services: We will sign off for now. Please re-consult our service if needed for any concerning changes in the patient's condition, discharge planning, or questions.  Communication: Treatment team members (and family members if applicable) who were involved in treatment/care discussions and planning, and with whom we spoke or engaged with via  secure text/chat, include the following: primary team  Thank you for involving [place] in the care of this patient. If you have any additional questions or concerns, please call [phone] and ask  for me or the provider on-call.  Total time spent in this encounter was 60 minutes with greater than 50% of time spent in counseling and coordination of care.  TELEPSYCHIATRY ATTESTATION & CONSENT  As the provider for this telehealth consult, I attest that I verified the patient's identity using two separate identifiers, introduced myself to the patient, provided my credentials, disclosed my location, and performed this encounter via a HIPAA-compliant, real-time, face-to-face, two-way, interactive audio and video platform and with the full consent and agreement of the patient (or guardian as applicable.)  Patient physical location: [id number]/ED23AA. Telehealth provider physical location: home office in state of [place].  Video start time: 2315 (Central Time) Video end time: 2335 [facility] Time)  IDENTIFYING DATA  Larry Ball is a 27 y.o. year-old male for whom a psychiatric consultation has been ordered by the primary provider. The patient was identified using two separate identifiers.  CHIEF COMPLAINT/REASON FOR CONSULT  Suicide attempt   HISTORY OF PRESENT ILLNESS (HPI)  I evaluated the patient today face-to-face via secure, HIPAA-compliant telepsychiatric connection, and at the request of the primary treatment team. The reason for the telepsychiatric consultation is that the patient is a 27 year old male who presents for psychiatric evaluation following intentional ingestion as a suicide attempt. Primary team is seeking psychotropic medication recommendations, safety evaluation to determine appropriateness for more intensive psychiatric services and diagnostic clarity as to the patient's presentation.   During one-on-one evaluation with this provider, patient was alert and oriented to self and generally  to location and situation. The patient did not appear to be inappropriately internally preoccupied; patient's thought process was linear and concrete. Patient related that he has been overwhelmed by multiple stressors including caring for his aging parents and witnessing their continued decline and mourning the death of his brother. Patient reported that given that he was ruminating on these stressors, he impulsively took an intentional ingestion of "13" tablets of his prescribed psychotropic medications given that he felt suicidal at the time. He reported that he knew how many tablets that he had taken given that he had counted them out. Patient then admitted that some time elapsed before he began to feel physically ill, which then caused him to feel frightened about further deterioration. He sought help from his parents, who voiced they concern and then emergency services were called by patient. He reported that between 10-30 minutes elapsed between patient taking the intentional ingestion and calling first responders. He reported that he was relieved when he realized that this was likely not a fatal suicide attempt. He reports that he is "better now"; however, he was unable to articulate what had changed beyond surviving the suicide attempt that has led to this rapid improvement in mental status. Patient denies being accompanied by significant other during prior assessments performed in the ED after he was asked this several times. Attempts were made to engage patient in treatment and safety planning; however, patient admitted that when he had spoken to primary team earlier that he was "not ready" to engage in outpatient psychotherapy. This provider then questioned why he is not willing to engage in psychotherapy; however, upon being questioned about this, patient quickly reversed his statement and then asserted that he voiced support for engaging in psychotherapy fully. He denies current suicidal and homicidal  intent; he is future oriented to return home.  Per collateral from patient's parents, which were called over the phone: They reported that they're concerned about the patient potentially staying in the  hospital  given that he provides much support for them in terms of their cares. In fact, patient lives with his parents. Mother related that patient is enduring a very difficult time  given discordant relationship with his daughter's mother, who is constantly calling patient disparaging names and trying to find ways to finagle money away from him. The day preceding his presentation to the ED he and his daughter's mother were speaking frequently back and forth as daughter was being evaluated for a persistent cough. Mother asserted that daughter's mother claimed that patient is indifferent to his daughter's welfare when in fact, patient stayed up all night intent to hear further updates about his daughter's well-being. Mother asserted that  it was likely an argument that transpired between he and significant other that led to his suicide attempt, which mother emphasizes "came out of nowhere". However, she did admit that patient is very private about his innermost thoughts and emotions and will not process his stressors. Mother emphasized that the individual that required a mental health evaluation is his daughter's mother and not him; mother asserts that he should come home as soon as possible so that he may continue to support their cares in the community.   PAST PSYCHIATRIC HISTORY  Inpatient psychiatric treatment: per patient, denies  Outpatient mental health treatment: per patient, PCP is managing his psychotropics   Current home psychotropic medications: per patient, unable to recall Previous mental health diagnoses: per mother, unspecified learning disability, anxiety, depression  Suicide attempts: per patient, denies any further attempts  Trauma history: patient did not assert further concerns for  trauma/exploitation beyond described in HPI  Otherwise as per HPI above.  PAST MEDICAL HISTORY  Past Medical History:  Diagnosis Date   Asthma    Bipolar 1 disorder [facility])       HOME MEDICATIONS  Facility Ordered Medications  Medication   [COMPLETED] sodium chloride 0.9 % bolus 1,000 mL   ibuprofen (ADVIL) tablet 600 mg   ondansetron (ZOFRAN) tablet 4 mg   alum & mag hydroxide-simeth (MAALOX/MYLANTA) 200-200-20 MG/5ML suspension 30 mL   PTA Medications  Medication Sig   lithium 300 MG tablet Take 300 mg by mouth 3 (three) times daily.   ARIPiprazole (ABILIFY) 10 MG tablet Take 10 mg by mouth daily.   lisdexamfetamine (VYVANSE) 70 MG capsule Take 70 mg by mouth daily.   GuanFACINE HCl (INTUNIV) 3 MG TB24 Take by mouth.   benzonatate (TESSALON PERLES) 100 MG capsule Take 1 capsule (100 mg total) by mouth every 6 (six) hours as needed for cough.   albuterol (VENTOLIN HFA) 108 (90 Base) MCG/ACT inhaler Inhale 2 puffs into the lungs every 4 (four) hours as needed for wheezing or shortness of breath.   ondansetron (ZOFRAN-ODT) 4 MG disintegrating tablet Take 1 tablet (4 mg total) by mouth every 8 (eight) hours as needed for nausea or vomiting.   HYDROcodone-acetaminophen (NORCO) 5-325 MG tablet Take 1 tablet by mouth every 4 (four) hours as needed for moderate pain.   menthol-cetylpyridinium (CEPACOL REGULAR STRENGTH) 3 MG lozenge Take 1 lozenge (3 mg total) by mouth as needed for sore throat.    ALLERGIES  Allergies  Allergen Reactions   Promethazine Other (See Comments)    Unknown     SOCIAL & SUBSTANCE USE HISTORY  Social History   Socioeconomic History   Marital status: Single    Spouse name: Not on file   Number of children: Not on file   Years of education: Not on file  Highest education level: Not on file  Occupational History   Not on file  Tobacco Use   Smoking status: Never   Smokeless tobacco: Never  Vaping Use   Vaping status: Never Used  Substance and  Sexual Activity   Alcohol use: No   Drug use: No   Sexual activity: Yes  Other Topics Concern   Not on file  Social History Narrative   Not on file   Social Drivers of Health   Financial Resource Strain: Low Risk  (03/31/2022)   [facility] from [facility], [facility]   Overall Financial Resource Strain (CARDIA)    Difficulty of Paying Living Expenses: Not hard at all  Food Insecurity: No Food Insecurity (03/31/2022)   Received from [facility], [facility]   Hunger Vital Sign    Worried About Running Out of Food in the Last Year: Never true    Ran Out of Food in the Last Year: Never true  Transportation Needs: No Transportation Needs (03/31/2022)   Received from [facility], [facility]   [facility] - Transportation    In the past 12 months, has lack of transportation kept you from medical appointments or from getting medications?: No    Lack of Transportation (Non-Medical): No  Physical Activity: Not on file  Stress: Not on file  Social Connections: Not on file   Social History   Tobacco Use  Smoking Status Never  Smokeless Tobacco Never   Social History   Substance and Sexual Activity  Alcohol Use No   Social History   Substance and Sexual Activity  Drug Use No    Additional pertinent information none disclosed .  FAMILY HISTORY  History reviewed. No pertinent family history. Family Psychiatric History (if known):  none disclosed   MENTAL STATUS EXAM (MSE)  Mental Status Exam: General Appearance: Fairly Groomed  Orientation:  Full (Time, Place, and Person)  Memory:  Immediate;   Fair Recent;   Fair Remote;   Fair  Concentration:  Concentration: Fair and Attention Span: Fair  Recall:  Fair  Attention  Fair  Eye Contact:  Fair  Speech:  Clear and Coherent  Language:  Fair  Volume:  Normal  Mood: fine  Affect:  Congruent  Thought Process:  Coherent   Thought Content:  Illogical  Suicidal Thoughts:  No  Homicidal Thoughts:  No  Judgement:  Poor  Insight:  Shallow  Psychomotor Activity:  Normal  Akathisia:  No  Fund of Knowledge:  Poor    Assets:  Social Support  Cognition:  WNL  ADL's:  Intact  AIMS (if indicated):       VITALS  Blood pressure (!) 141/75, pulse 86, temperature 98 F (36.7 C), temperature source Oral, resp. rate 17, SpO2 96%.  LABS  Admission on 01/05/2023  Component Date Value Ref Range Status   Acetaminophen (Tylenol), Serum 01/05/2023 <10 (L)  10 - 30 ug/mL Final   Comment: (NOTE) Therapeutic concentrations vary significantly. A range of 10-30 ug/mL  may be an effective concentration for many patients. However, some  are best treated at concentrations outside of this range. Acetaminophen concentrations >150 ug/mL at 4 hours after ingestion  and >50 ug/mL at 12 hours after ingestion are often associated with  toxic reactions.  Performed at [facility], [address] [place], [place] [address]    Sodium 01/05/2023 137  135 - 145 mmol/L Final   Potassium 01/05/2023  3.5  3.5 - 5.1 mmol/L Final   Chloride 01/05/2023 101  98 - 111 mmol/L Final   CO2 01/05/2023 27  22 - 32 mmol/L Final   Glucose, Bld 01/05/2023 89  70 - 99 mg/dL Final   Glucose reference range applies only to samples taken after fasting for at least 8 hours.   BUN 01/05/2023 12  6 - 20 mg/dL Final   Creatinine, Ser 01/05/2023 1.57 (H)  0.61 - 1.24 mg/dL Final   Calcium 16/10/9602 8.5 (L)  8.9 - 10.3 mg/dL Final   Total Protein 54/09/8117 7.3  6.5 - 8.1 g/dL Final   Albumin 14/78/2956 3.5  3.5 - 5.0 g/dL Final   AST 21/30/8657 24  15 - 41 U/L Final   ALT 01/05/2023 31  0 - 44 U/L Final   Alkaline Phosphatase 01/05/2023 36 (L)  38 - 126 U/L Final   Total Bilirubin 01/05/2023 0.6  <1.2 mg/dL Final   GFR, Estimated 01/05/2023 >60  >60 mL/min Final   Comment: (NOTE) Calculated using the CKD-EPI Creatinine Equation (2021)     Anion gap 01/05/2023 9  5 - 15 Final   Performed at [facility], [address] [place], [place] [address]   Lipase 01/05/2023 33  11 - 51 U/L Final   Performed at [facility], [address] [place], [place] [address]   WBC 01/05/2023 8.2  4.0 - 10.5 K/uL Final   RBC 01/05/2023 4.87  4.22 - 5.81 MIL/uL Final   Hemoglobin 01/05/2023 14.8  13.0 - 17.0 g/dL Final   HCT 41/32/4401 44.2  39.0 - 52.0 % Final   MCV 01/05/2023 90.8  80.0 - 100.0 fL Final   MCH 01/05/2023 30.4  26.0 - 34.0 pg Final   MCHC 01/05/2023 33.5  30.0 - 36.0 g/dL Final   RDW 02/72/5366 13.2  11.5 - 15.5 % Final   Platelets 01/05/2023 390  150 - 400 K/uL Final   nRBC 01/05/2023 0.0  0.0 - 0.2 % Final   Neutrophils Relative % 01/05/2023 66  % Final   Neutro Abs 01/05/2023 5.4  1.7 - 7.7 K/uL Final   Lymphocytes Relative 01/05/2023 25  % Final   Lymphs Abs 01/05/2023 2.1  0.7 - 4.0 K/uL Final   Monocytes Relative 01/05/2023 8  % Final   Monocytes Absolute 01/05/2023 0.6  0.1 - 1.0 K/uL Final   Eosinophils Relative 01/05/2023 1  % Final   Eosinophils Absolute 01/05/2023 0.1  0.0 - 0.5 K/uL Final   Basophils Relative 01/05/2023 0  % Final   Basophils Absolute 01/05/2023 0.0  0.0 - 0.1 K/uL Final   Immature Granulocytes 01/05/2023 0  % Final   Abs Immature Granulocytes 01/05/2023 0.01  0.00 - 0.07 K/uL Final   Performed at [facility], [address] [place], [place] [address]   Salicylate Lvl 01/05/2023 <7.0 (L)  7.0 - 30.0 mg/dL Final   Performed at [facility], [address] [place], [place] [address]    PSYCHIATRIC REVIEW OF SYSTEMS (ROS)  ROS: Notable for the following relevant positive findings: Review of Systems  Psychiatric/Behavioral:  Positive for depression and suicidal ideas. Negative for hallucinations, memory loss and substance abuse. The patient is nervous/anxious and has insomnia.     Additional findings:      Musculoskeletal: No abnormal movements  observed      Gait & Station: Laying/Sitting      Pain Screening: Denies      Nutrition & Dental  Concerns: none disclosed   RISK FORMULATION/ASSESSMENT  Is the patient experiencing any suicidal or homicidal ideations: Yes       Explain if yes: suicide attempt Protective factors considered for safety management: current care in a highly monitored health care setting,  Risk factors/concerns considered for safety management:  Prior attempt Depression Access to lethal means Hopelessness Impulsivity Barriers to accessing treatment Unwillingness to seek help Male gender Unmarried  Is there a safety management plan with the patient and treatment team to minimize risk factors and promote protective factors: Yes           Explain: psychiatric hospitalization Is crisis care placement or psychiatric hospitalization recommended: Yes     Based on my current evaluation and risk assessment, patient is determined at this time to be at:  High risk  *RISK ASSESSMENT Risk assessment is a dynamic process; it is possible that this patient's condition, and risk level, may change. This should be re-evaluated and managed over time as appropriate. Please re-consult psychiatric consult services if additional assistance is needed in terms of risk assessment and management. If your team decides to discharge this patient, please advise the patient how to best access emergency psychiatric services, or to call 911, if their condition worsens or they feel unsafe in any way.   Rodena Medin, MD Telepsychiatry Consult Services

## 2023-01-06 NOTE — ED Notes (Signed)
Visitor at this time

## 2023-01-06 NOTE — ED Provider Notes (Signed)
-----------------------------------------   2:47 AM on 01/06/2023 -----------------------------------------   ED ECG REPORT I, Trini Soldo J, the attending physician, personally viewed and interpreted this ECG.   Date: 01/06/2023  EKG Time: 0209  Rate: 68  Rhythm: normal sinus rhythm  Axis: RAD  Intervals:none  ST&T Change: Nonspecific  Patient cleared by poison control.  Repeat acetaminophen and salicylate levels unremarkable.  Patient is medically cleared for psychiatric evaluation and disposition.    Irean Hong, MD 01/06/23 [phone]

## 2023-01-06 NOTE — Progress Notes (Signed)
This provider spoke with his mother, his legal guardian, at length about her son's stressors and her concerns about him being in the hospital as she and her son would prefer to follow up with Dr. Janeece Riggers, his regular provider.  This provider used therapeutic communication to listen to her concerns as she "is not pleased with that hospital and do not want my son to be there."  The provider politely let her know the decision was made for inpatient per the psychiatrist based on the severity of his suicide attempt.  "I don't want him to keep on being held and him not getting any help.  I don't want him in the system, I want him to go to see Dr. Janeece Riggers."  She continued with "I know he needs to be on that shot and the medicine Dr. Janeece Riggers is giving him is not working."  Nanine Means, PMHNP.

## 2023-01-06 NOTE — ED Notes (Signed)
Patient's mother visiting at this time

## 2023-01-06 NOTE — ED Notes (Addendum)
Patient transferred to room 2 in the BHU, Nurse received report regarding Patient from the [place] RN

## 2023-01-06 NOTE — ED Notes (Signed)
Poison control cleared patient at this time. 

## 2023-01-06 NOTE — ED Notes (Signed)
IVC/PENDING PSYCH CONSULT 

## 2023-01-06 NOTE — ED Notes (Signed)
Lord, NP sent secure chat that patients legal guardian (mother) would like a phone call to be updated by psychiatrist.

## 2023-01-06 NOTE — ED Notes (Signed)
Nurse spoke with Patient and He ask about the treatment and how it would work, explain to him that a bed search would go out and that He would be sent to the unit here or else where depending on what was available, and He remained calm, states " I did something dumb, and when nurse tried to get him to open up about what happened, He states that He does not want to talk about it, Nurse let him know that He wants to talk just to let the nurse know, and He agreed. Patient's saline lock removed per nurse without difficulty.   t

## 2023-01-07 ENCOUNTER — Inpatient Hospital Stay
Admission: AD | Admit: 2023-01-07 | Discharge: 2023-01-11 | DRG: 885 | Disposition: A | Discharge status: Home / Self Care | Payer: 59 | Source: Intra-hospital | Attending: Psychiatry | Admitting: Psychiatry

## 2023-01-07 ENCOUNTER — Encounter: Payer: Self-pay | Admitting: Adult Health

## 2023-01-07 ENCOUNTER — Other Ambulatory Visit: Payer: Self-pay

## 2023-01-07 DIAGNOSIS — Z733 Stress, not elsewhere classified: Secondary | ICD-10-CM

## 2023-01-07 DIAGNOSIS — F5102 Adjustment insomnia: Secondary | ICD-10-CM | POA: Diagnosis present

## 2023-01-07 DIAGNOSIS — Z659 Problem related to unspecified psychosocial circumstances: Secondary | ICD-10-CM | POA: Diagnosis not present

## 2023-01-07 DIAGNOSIS — T50902A Poisoning by unspecified drugs, medicaments and biological substances, intentional self-harm, initial encounter: Secondary | ICD-10-CM | POA: Diagnosis present

## 2023-01-07 DIAGNOSIS — F312 Bipolar disorder, current episode manic severe with psychotic features: Secondary | ICD-10-CM | POA: Diagnosis present

## 2023-01-07 DIAGNOSIS — Z634 Disappearance and death of family member: Secondary | ICD-10-CM | POA: Diagnosis not present

## 2023-01-07 DIAGNOSIS — F419 Anxiety disorder, unspecified: Secondary | ICD-10-CM | POA: Diagnosis present

## 2023-01-07 DIAGNOSIS — F4321 Adjustment disorder with depressed mood: Secondary | ICD-10-CM | POA: Diagnosis present

## 2023-01-07 DIAGNOSIS — T391X2A Poisoning by 4-Aminophenol derivatives, intentional self-harm, initial encounter: Secondary | ICD-10-CM

## 2023-01-07 DIAGNOSIS — Z79899 Other long term (current) drug therapy: Secondary | ICD-10-CM | POA: Diagnosis not present

## 2023-01-07 DIAGNOSIS — T438X2A Poisoning by other psychotropic drugs, intentional self-harm, initial encounter: Secondary | ICD-10-CM | POA: Insufficient documentation

## 2023-01-07 DIAGNOSIS — Z636 Dependent relative needing care at home: Secondary | ICD-10-CM | POA: Diagnosis not present

## 2023-01-07 DIAGNOSIS — Z888 Allergy status to other drugs, medicaments and biological substances status: Secondary | ICD-10-CM | POA: Diagnosis not present

## 2023-01-07 DIAGNOSIS — F332 Major depressive disorder, recurrent severe without psychotic features: Principal | ICD-10-CM | POA: Insufficient documentation

## 2023-01-07 MED ORDER — ONDANSETRON HCL 4 MG PO TABS: 4.0000 mg | ORAL_TABLET | Freq: Three times a day (TID) | ORAL | Status: DC | PRN

## 2023-01-07 MED ORDER — LORAZEPAM 2 MG/ML IJ SOLN: 2.0000 mg | Freq: Three times a day (TID) | INTRAMUSCULAR | Status: DC | PRN

## 2023-01-07 MED ORDER — ARIPIPRAZOLE 5 MG PO TABS
5.0000 mg | ORAL_TABLET | Freq: Every day | ORAL | Status: DC
  Administered 2023-01-07: 5 mg via ORAL
  Filled 2023-01-07: qty 1

## 2023-01-07 MED ORDER — LORAZEPAM 2 MG/ML IJ SOLN: 1.0000 mg | Freq: Three times a day (TID) | INTRAMUSCULAR | Status: DC | PRN

## 2023-01-07 MED ORDER — VALACYCLOVIR HCL 500 MG PO TABS: 1000.0000 mg | ORAL_TABLET | Freq: Every day | ORAL | Status: DC | PRN

## 2023-01-07 MED ORDER — LORAZEPAM 1 MG PO TABS
1.0000 mg | ORAL_TABLET | Freq: Three times a day (TID) | ORAL | Status: DC | PRN
Start: 2023-01-07 — End: 2023-01-07
  Administered 2023-01-07 (×2): 1 mg via ORAL
  Filled 2023-01-07 (×2): qty 1

## 2023-01-07 MED ORDER — ACETAMINOPHEN 325 MG PO TABS: 650.0000 mg | ORAL_TABLET | Freq: Four times a day (QID) | ORAL | Status: DC | PRN

## 2023-01-07 MED ORDER — ALUM & MAG HYDROXIDE-SIMETH 200-200-20 MG/5ML PO SUSP
30.0000 mL | Freq: Four times a day (QID) | ORAL | Status: DC | PRN
Start: 2023-01-07 — End: 2023-01-11

## 2023-01-07 MED ORDER — IBUPROFEN 600 MG PO TABS: 600.0000 mg | ORAL_TABLET | Freq: Three times a day (TID) | ORAL | Status: DC | PRN

## 2023-01-07 MED ORDER — ARIPIPRAZOLE 5 MG PO TABS
5.0000 mg | ORAL_TABLET | Freq: Every day | ORAL | Status: DC
  Administered 2023-01-08: 5 mg via ORAL
  Filled 2023-01-07: qty 1

## 2023-01-07 MED ORDER — DIPHENHYDRAMINE HCL 50 MG/ML IJ SOLN: 50.0000 mg | Freq: Three times a day (TID) | INTRAMUSCULAR | Status: DC | PRN

## 2023-01-07 MED ORDER — HALOPERIDOL LACTATE 5 MG/ML IJ SOLN: 5.0000 mg | Freq: Three times a day (TID) | INTRAMUSCULAR | Status: DC | PRN

## 2023-01-07 MED ORDER — MAGNESIUM HYDROXIDE 400 MG/5ML PO SUSP: 30.0000 mL | Freq: Every day | ORAL | Status: DC | PRN

## 2023-01-07 NOTE — ED Notes (Signed)
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

## 2023-01-07 NOTE — Tx Team (Signed)
Initial Treatment Plan 01/07/2023 9:18 PM ERVIE MOTHERSHEAD [id number]    PATIENT STRESSORS: Marital or family conflict   Medication change or noncompliance     PATIENT STRENGTHS: Communication skills  Motivation for treatment/growth    PATIENT IDENTIFIED PROBLEMS: Suicidal Ideation  Family Stressor  Medication non compliance.                 DISCHARGE CRITERIA:  Improved stabilization in mood, thinking, and/or behavior  PRELIMINARY DISCHARGE PLAN: Outpatient therapy  PATIENT/FAMILY INVOLVEMENT: This treatment plan has been presented to and reviewed with the patient, SOO RALL, and/or family member,  .  The patient and family have been given the opportunity to ask questions and make suggestions.  Shelia Media, RN 01/07/2023, 9:18 PM

## 2023-01-07 NOTE — ED Notes (Signed)
IVC/Pending placement 

## 2023-01-07 NOTE — ED Notes (Signed)
Morning snack provided to pt; Pt tolerated well; Waste disposed of properly

## 2023-01-07 NOTE — ED Notes (Signed)
Lunch tray provided; Pt tolerated well; Waste disposed of properly

## 2023-01-07 NOTE — ED Notes (Signed)
IVC Pt  Pending disposition

## 2023-01-07 NOTE — ED Notes (Signed)
Snack given to pt in room; Pt tolerated well; Waste disposed of properly

## 2023-01-07 NOTE — ED Notes (Signed)
Pt went to the bathroom.

## 2023-01-07 NOTE — Progress Notes (Signed)
Pt admitted from [facility], report received from [place] ,[place]. Pt calm and pleasant during assessment. Pt denies SI/HI/AVH. Pt stated he attempted suicide by overdosing on his Abilify. PT stated he has had worsening depression and felt bad because he couldn't get his daughter everything she wanted for christmas. Pt oriented to the unit. Pt given education, support, and encouragement to be active in his treatment plan. Pt being monitored Q 15 minutes for safety per unit protocol, remains safe on the unit

## 2023-01-07 NOTE — ED Notes (Signed)
This tech provided pt with dinner tray.

## 2023-01-07 NOTE — ED Notes (Signed)
Pt reported fluttering of eyes and stated "I feel like I'm going to pass out"  vitals continue to be WNL, on-call ED MD notified

## 2023-01-07 NOTE — ED Notes (Signed)
PT IVC PENDING DISPOSITION  

## 2023-01-07 NOTE — ED Provider Notes (Signed)
Emergency Medicine Observation Re-evaluation Note  Larry Ball is a 27 y.o. male, seen on rounds today.  Pt initially presented to the ED for complaints of Drug Overdose   Physical Exam  BP 128/64 (BP Location: Right Arm)   Pulse 62   Temp 98.7 F (37.1 C) (Oral)   Resp 17   SpO2 97%  Physical Exam General: nad  ED Course / MDM  EKG:   I have reviewed the labs performed to date as well as medications administered while in observation.     Plan  Current plan is for psych    Willy Eddy, MD 01/07/23 [phone]

## 2023-01-07 NOTE — ED Notes (Signed)
Hospital meal provided, pt tolerated w/o complaints.  Waste discarded appropriately.  

## 2023-01-07 NOTE — ED Notes (Signed)
Pt provided with breakfast tray. Pt sitting up eating

## 2023-01-07 NOTE — ED Notes (Addendum)
This tech rechecked pt vitals per RN request and informed RN of results.

## 2023-01-07 NOTE — ED Notes (Signed)
Pt provided with shower supplies and clean scrubs. Pt taking shower.

## 2023-01-08 ENCOUNTER — Encounter: Payer: Self-pay | Admitting: Adult Health

## 2023-01-08 DIAGNOSIS — F312 Bipolar disorder, current episode manic severe with psychotic features: Secondary | ICD-10-CM | POA: Diagnosis not present

## 2023-01-08 DIAGNOSIS — F4321 Adjustment disorder with depressed mood: Secondary | ICD-10-CM | POA: Diagnosis present

## 2023-01-08 DIAGNOSIS — T438X2A Poisoning by other psychotropic drugs, intentional self-harm, initial encounter: Secondary | ICD-10-CM | POA: Insufficient documentation

## 2023-01-08 DIAGNOSIS — T391X2A Poisoning by 4-Aminophenol derivatives, intentional self-harm, initial encounter: Secondary | ICD-10-CM

## 2023-01-08 MED ORDER — BUPROPION HCL 75 MG PO TABS
75.0000 mg | ORAL_TABLET | Freq: Every day | ORAL | Status: DC
  Administered 2023-01-08 – 2023-01-11 (×4): 75 mg via ORAL
  Filled 2023-01-08 (×4): qty 1

## 2023-01-08 MED ORDER — HYDROXYZINE HCL 50 MG PO TABS: 50.0000 mg | ORAL_TABLET | Freq: Three times a day (TID) | ORAL | Status: DC | PRN

## 2023-01-08 MED ORDER — DIVALPROEX SODIUM 250 MG PO DR TAB
250.0000 mg | DELAYED_RELEASE_TABLET | Freq: Two times a day (BID) | ORAL | Status: DC
  Administered 2023-01-08 – 2023-01-11 (×7): 250 mg via ORAL
  Filled 2023-01-08 (×7): qty 1

## 2023-01-08 MED ORDER — MELATONIN 5 MG PO TABS
2.5000 mg | ORAL_TABLET | Freq: Every day | ORAL | Status: DC
  Administered 2023-01-08 – 2023-01-10 (×3): 2.5 mg via ORAL
  Filled 2023-01-08 (×3): qty 1

## 2023-01-08 NOTE — BH IP Treatment Plan (Signed)
Interdisciplinary Treatment and Diagnostic Plan Update  01/08/2023 Time of Session: 08:57 Larry Ball MRN: [id number]  Principal Diagnosis: MDD (major depressive disorder), recurrent episode, severe (HCC)  Secondary Diagnoses: Principal Problem:   MDD (major depressive disorder), recurrent episode, severe (HCC)   Current Medications:  Current Facility-Administered Medications  Medication Dose Route Frequency Provider Last Rate Last Admin   acetaminophen (TYLENOL) tablet 650 mg  650 mg Oral Q6H PRN Chales Abrahams, NP       alum & mag hydroxide-simeth (MAALOX/MYLANTA) 200-200-20 MG/5ML suspension 30 mL  30 mL Oral Q6H PRN Chales Abrahams, NP       ARIPiprazole (ABILIFY) tablet 5 mg  5 mg Oral Daily Ophelia Shoulder E, NP   5 mg at 01/08/23 0831   haloperidol lactate (HALDOL) injection 5 mg  5 mg Intramuscular TID PRN Chales Abrahams, NP       And   diphenhydrAMINE (BENADRYL) injection 50 mg  50 mg Intramuscular TID PRN Chales Abrahams, NP       And   LORazepam (ATIVAN) injection 2 mg  2 mg Intramuscular TID PRN Ophelia Shoulder E, NP       ibuprofen (ADVIL) tablet 600 mg  600 mg Oral Q8H PRN Ophelia Shoulder E, NP       magnesium hydroxide (MILK OF MAGNESIA) suspension 30 mL  30 mL Oral Daily PRN Chales Abrahams, NP       ondansetron (ZOFRAN) tablet 4 mg  4 mg Oral Q8H PRN Chales Abrahams, NP       valACYclovir (VALTREX) tablet 1,000 mg  1,000 mg Oral Daily PRN Chales Abrahams, NP       PTA Medications: Medications Prior to Admission  Medication Sig Dispense Refill Last Dose/Taking   albuterol (VENTOLIN HFA) 108 (90 Base) MCG/ACT inhaler Inhale 2 puffs into the lungs every 4 (four) hours as needed for wheezing or shortness of breath. (Patient not taking: Reported on 01/06/2023) 1 each 1    ARIPiprazole (ABILIFY) 5 MG tablet Take 5 mg by mouth daily. (Patient not taking: Reported on 01/06/2023)      benzonatate (TESSALON PERLES) 100 MG capsule Take 1 capsule (100 mg total) by mouth  every 6 (six) hours as needed for cough. (Patient not taking: Reported on 01/06/2023) 20 capsule 0    famotidine (PEPCID) 20 MG tablet Take 1 tablet (20 mg total) by mouth 2 (two) times daily. 60 tablet 1    GuanFACINE HCl (INTUNIV) 3 MG TB24 Take by mouth. (Patient not taking: Reported on 01/06/2023)      HYDROcodone-acetaminophen (NORCO) 5-325 MG tablet Take 1 tablet by mouth every 4 (four) hours as needed for moderate pain. (Patient not taking: Reported on 01/06/2023) 10 tablet 0    lisdexamfetamine (VYVANSE) 70 MG capsule Take 70 mg by mouth daily. (Patient not taking: Reported on 01/06/2023)      lithium 300 MG tablet Take 300 mg by mouth 3 (three) times daily. (Patient not taking: Reported on 01/06/2023)      loratadine (CLARITIN) 10 MG tablet Take 1 tablet (10 mg total) by mouth daily. 30 tablet 2    lurasidone (LATUDA) 20 MG TABS tablet Take 20 mg by mouth daily. (Patient not taking: Reported on 01/06/2023)      menthol-cetylpyridinium (CEPACOL REGULAR STRENGTH) 3 MG lozenge Take 1 lozenge (3 mg total) by mouth as needed for sore throat. (Patient not taking: Reported on 01/06/2023) 100 tablet 12    ondansetron (ZOFRAN-ODT) 4 MG disintegrating  tablet Take 1 tablet (4 mg total) by mouth every 8 (eight) hours as needed for nausea or vomiting. (Patient not taking: Reported on 01/06/2023) 20 tablet 0    valACYclovir (VALTREX) 1000 MG tablet Take 1,000 mg by mouth daily as needed (flare).       Patient Stressors: Marital or family conflict   Medication change or noncompliance    Patient Strengths: Printmaker for treatment/growth   Treatment Modalities: Medication Management, Group therapy, Case management,  1 to 1 session with clinician, Psychoeducation, Recreational therapy.   Physician Treatment Plan for Primary Diagnosis: MDD (major depressive disorder), recurrent episode, severe (HCC) Long Term Goal(s):     Short Term Goals:    Medication Management: Evaluate  patient's response, side effects, and tolerance of medication regimen.  Therapeutic Interventions: 1 to 1 sessions, Unit Group sessions and Medication administration.  Evaluation of Outcomes: Not Met  Physician Treatment Plan for Secondary Diagnosis: Principal Problem:   MDD (major depressive disorder), recurrent episode, severe (HCC)  Long Term Goal(s):     Short Term Goals:       Medication Management: Evaluate patient's response, side effects, and tolerance of medication regimen.  Therapeutic Interventions: 1 to 1 sessions, Unit Group sessions and Medication administration.  Evaluation of Outcomes: Not Met   RN Treatment Plan for Primary Diagnosis: MDD (major depressive disorder), recurrent episode, severe (HCC) Long Term Goal(s): Knowledge of disease and therapeutic regimen to maintain health will improve  Short Term Goals: Ability to remain free from injury will improve, Ability to verbalize frustration and anger appropriately will improve, Ability to demonstrate self-control, Ability to participate in decision making will improve, Ability to verbalize feelings will improve, Ability to disclose and discuss suicidal ideas, Ability to identify and develop effective coping behaviors will improve, and Compliance with prescribed medications will improve  Medication Management: RN will administer medications as ordered by provider, will assess and evaluate patient's response and provide education to patient for prescribed medication. RN will report any adverse and/or side effects to prescribing provider.  Therapeutic Interventions: 1 on 1 counseling sessions, Psychoeducation, Medication administration, Evaluate responses to treatment, Monitor vital signs and CBGs as ordered, Perform/monitor CIWA, COWS, AIMS and Fall Risk screenings as ordered, Perform wound care treatments as ordered.  Evaluation of Outcomes: Not Met   LCSW Treatment Plan for Primary Diagnosis: MDD (major depressive  disorder), recurrent episode, severe (HCC) Long Term Goal(s): Safe transition to appropriate next level of care at discharge, Engage patient in therapeutic group addressing interpersonal concerns.  Short Term Goals: Engage patient in aftercare planning with referrals and resources, Increase social support, Increase ability to appropriately verbalize feelings, Increase emotional regulation, Facilitate acceptance of mental health diagnosis and concerns, and Increase skills for wellness and recovery  Therapeutic Interventions: Assess for all discharge needs, 1 to 1 time with Social worker, Explore available resources and support systems, Assess for adequacy in community support network, Educate family and significant other(s) on suicide prevention, Complete Psychosocial Assessment, Interpersonal group therapy.  Evaluation of Outcomes: Not Met   Progress in Treatment: Attending groups: No. Participating in groups: No. Taking medication as prescribed: Yes. Toleration medication: Yes. Family/Significant other contact made: No, will contact:  when given permission. Patient understands diagnosis: Yes. Discussing patient identified problems/goals with staff: Yes. Medical problems stabilized or resolved: Yes. Denies suicidal/homicidal ideation: Yes. Issues/concerns per patient self-inventory: No. Other: none.  New problem(s) identified: No, Describe:  none identified.  New Short Term/Long Term Goal(s): medication management for mood stabilization;  elimination of SI thoughts; development of comprehensive mental wellness plan.  Patient Goals:  "Just learn from my mistakes I've done before and make it better. Be better."  Discharge Plan or Barriers: CSW will assist pt with development of an appropriate aftercare/discharge plan.   Reason for Continuation of Hospitalization: Depression Medication stabilization Suicidal ideation  Estimated Length of Stay: 1-7 days  Last 3 [place] Suicide  Severity Risk Score: Flowsheet Row Admission (Current) from 01/07/2023 in [facility] ED from 01/05/2023 in [facility] Emergency Department [facility] ED from 12/03/2021 in [facility] Emergency Department at [facility]  C-SSRS RISK CATEGORY High Risk High Risk No Risk       Last PHQ 2/9 Scores:     No data to display          Scribe for Treatment Team: Glenis Smoker, LCSW 01/08/2023 9:34 AM

## 2023-01-08 NOTE — Plan of Care (Signed)
Pt new to the unit, hasn't had time to progress  Problem: Education: Goal: Knowledge of [facility] Education information/materials will improve Outcome: Not Progressing Goal: Emotional status will improve Outcome: Not Progressing Goal: Mental status will improve Outcome: Not Progressing Goal: Verbalization of understanding the information provided will improve Outcome: Not Progressing   Problem: Activity: Goal: Interest or engagement in activities will improve Outcome: Not Progressing Goal: Sleeping patterns will improve Outcome: Not Progressing   Problem: Coping: Goal: Ability to verbalize frustrations and anger appropriately will improve Outcome: Not Progressing Goal: Ability to demonstrate self-control will improve Outcome: Not Progressing   Problem: Health Behavior/Discharge Planning: Goal: Identification of resources available to assist in meeting health care needs will improve Outcome: Not Progressing Goal: Compliance with treatment plan for underlying cause of condition will improve Outcome: Not Progressing   Problem: Physical Regulation: Goal: Ability to maintain clinical measurements within normal limits will improve Outcome: Not Progressing   Problem: Safety: Goal: Periods of time without injury will increase Outcome: Not Progressing   Problem: Education: Goal: Knowledge of [place] [facility] Education information/materials will improve Outcome: Not Progressing Goal: Emotional status will improve Outcome: Not Progressing Goal: Mental status will improve Outcome: Not Progressing Goal: Verbalization of understanding the information provided will improve Outcome: Not Progressing   Problem: Activity: Goal: Interest or engagement in activities will improve Outcome: Not Progressing Goal: Sleeping patterns will improve Outcome: Not Progressing   Problem: Coping: Goal: Ability to verbalize frustrations and anger appropriately will improve Outcome:  Not Progressing Goal: Ability to demonstrate self-control will improve Outcome: Not Progressing   Problem: Health Behavior/Discharge Planning: Goal: Identification of resources available to assist in meeting health care needs will improve Outcome: Not Progressing Goal: Compliance with treatment plan for underlying cause of condition will improve Outcome: Not Progressing   Problem: Physical Regulation: Goal: Ability to maintain clinical measurements within normal limits will improve Outcome: Not Progressing   Problem: Safety: Goal: Periods of time without injury will increase Outcome: Not Progressing   Problem: Education: Goal: Ability to make informed decisions regarding treatment will improve Outcome: Not Progressing   Problem: Coping: Goal: Coping ability will improve Outcome: Not Progressing   Problem: Health Behavior/Discharge Planning: Goal: Identification of resources available to assist in meeting health care needs will improve Outcome: Not Progressing   Problem: Medication: Goal: Compliance with prescribed medication regimen will improve Outcome: Not Progressing   Problem: Self-Concept: Goal: Ability to disclose and discuss suicidal ideas will improve Outcome: Not Progressing Goal: Will verbalize positive feelings about self Outcome: Not Progressing Note: Patient is on track. Patient will maintain adherence

## 2023-01-08 NOTE — BHH Suicide Risk Assessment (Addendum)
[facility] Admission Suicide Risk Assessment   Nursing information obtained from:  Patient Demographic factors:  Low socioeconomic status Current Mental Status:  Suicidal ideation indicated by patient Loss Factors:  Loss of significant relationship Historical Factors:  Impulsivity Risk Reduction Factors:  Positive social support, Positive therapeutic relationship  Total Time spent with patient: 1.5 hours Principal Problem: MDD (major depressive disorder), recurrent episode, severe (HCC) Diagnosis:  Principal Problem:   MDD (major depressive disorder), recurrent episode, severe (HCC)  Subjective Data: 27yo African American male reports intentional overdose of prescribed psychotropic medications due to feeling stressed and overwhelmed, stating, "I was trying to kill myself."Patient describes feeling overwhelmed by multiple stressors, including caregiving responsibilities for aging parents, grieving his brother's death, and conflict with his daughter's mother. He expresses significant emotional burden related to his mother's health condition, specifically her diagnosis of an inoperable brain tumor, which he says has exacerbated his depression.He reports that these stressors led to ruminations and impulsively taking the overdose. Patient admits to feeling suicidal at the time of the overdose but denies current suicidal or homicidal intent. He reports relief after surviving the attempt but is unable to articulate what has led to his improvement in mental status.Patient lives with and provides caregiving support for his aging parents.Conflict with his daughter's mother, who disparages him and accuses him of indifference toward his daughter, is a significant source of stress.Mother expresses concern about the patient's caregiving role and supports his return home.She attributes the suicide attempt to an argument with the daughter's mother, describing the incident as impulsive.Mother believes the patient  internalizes his emotions and stressors rather than processing them.She perceives the daughter's mother as the source of the conflict, emphasizing that the patient is not the one needing treatment.  Continued Clinical Symptoms:  Alcohol Use Disorder Identification Test Final Score (AUDIT): 0 The "Alcohol Use Disorders Identification Test", Guidelines for Use in Primary Care, Second Edition.  World Science writer [facility]). Score between 0-7:  no or low risk or alcohol related problems. Score between 8-15:  moderate risk of alcohol related problems. Score between 16-19:  high risk of alcohol related problems. Score 20 or above:  warrants further diagnostic evaluation for alcohol dependence and treatment.   CLINICAL FACTORS:   Depression:   Hopelessness Insomnia   Musculoskeletal: Strength & Muscle Tone: within normal limits Gait & Station: normal Patient leans: N/A  Psychiatric Specialty Exam:  Presentation  General Appearance: Fairly Groomed  Eye Contact:Good  Speech:Clear and Coherent; Normal Rate  Speech Volume:Normal  Handedness:Right   Mood and Affect  Mood:Depressed  Affect:Flat   Thought Process  Thought Processes:Coherent  Descriptions of Associations:Intact  Orientation:Full (Time, Place and Person)  Thought Content:Rumination  History of Schizophrenia/Schizoaffective disorder:No  Duration of Psychotic Symptoms:none recorded Hallucinations:Hallucinations: None  Ideas of Reference:None  Suicidal Thoughts:Suicidal Thoughts: No  Homicidal Thoughts:Homicidal Thoughts: No   Sensorium  Memory:Immediate Good; Remote Good  Judgment:Poor  Insight:Poor   Executive Functions  Concentration:Good  Attention Span:Fair  Recall:Good  Fund of Knowledge:Good  Language:Good   Psychomotor Activity  Psychomotor Activity:Psychomotor Activity: Normal   Assets  Assets:Communication Skills; Housing   Sleep  Sleep:Sleep: Fair Number of Hours  of Sleep: 5    Physical Exam: Physical Exam Vitals and nursing note reviewed.  Constitutional:      Appearance: Normal appearance.  HENT:     Head: Normocephalic and atraumatic.     Nose: Nose normal.  Pulmonary:     Effort: Pulmonary effort is normal.  Musculoskeletal:  General: Normal range of motion.     Cervical back: Normal range of motion.  Neurological:     General: No focal deficit present.     Mental Status: He is alert and oriented to person, place, and time. Mental status is at baseline.  Psychiatric:        Attention and Perception: Attention and perception normal.        Mood and Affect: Mood is depressed. Affect is flat.        Speech: Speech normal.        Behavior: Behavior normal. Behavior is cooperative.        Thought Content: Thought content normal.        Cognition and Memory: Cognition and memory normal.        Judgment: Judgment is impulsive.    Review of Systems  Psychiatric/Behavioral:  Positive for depression. The patient has insomnia.   All other systems reviewed and are negative.  Blood pressure 125/60, pulse 89, temperature 98 F (36.7 C), temperature source Oral, resp. rate 16, height 5\' 10"  (1.778 m), weight 104.3 kg, SpO2 100%. Body mass index is 32.99 kg/m.   COGNITIVE FEATURES THAT CONTRIBUTE TO RISK:  None    SUICIDE RISK:   Minimal: No identifiable suicidal ideation.  Patients presenting with no risk factors but with morbid ruminations; may be classified as minimal risk based on the severity of the depressive symptoms  PLAN OF CARE:  Discontinue Abilify due to its involvement in the overdose. Initiate Depakote ER 500 mg nightly for mood stabilization. Monitor therapeutic levels. Initiate Trazodone 50 mg nightly as needed for sleep, considering the patient's stress-related insomnia. Bupropion SR 150 mg low energy and motivation. Initiate individual psychotherapy with a focus on stress management, grief counseling, and conflict  resolution Monitor for withdrawal symptoms after discontinuing Abilify. I certify that inpatient services furnished can reasonably be expected to improve the patient's condition.   [name], NP 01/08/2023, 10:47 AM

## 2023-01-08 NOTE — Progress Notes (Signed)
Patient is pleasant and cooperative. Denies SI, HI, AVH. Medication compliant. Appropriate with staff and peers. Noted on phone talking with friends and family. Encouragement and support provided. Safety checks maintained. Medications given as prescribed. Pt receptive and remains safe on unit with q 15 min checks.

## 2023-01-08 NOTE — [facility] Counselor (Signed)
Adult Comprehensive Assessment  Patient ID: Larry Ball, male   DOB: 04-05-1995, 27 y.o.   MRN: [id number]  Information Source: Information source: Patient  Current Stressors:  Patient states their primary concerns and needs for treatment are:: "Made a bad choice, bad decision." Patient states their goals for this hospitilization and ongoing recovery are:: "Just learn from my mistakes I've done before and make it better. Be better." Educational / Learning stressors: Pt expresses that he would like to go back to school to become a Systems analyst. Employment / Job issues: He is currently unemployed but looking for a job. Family Relationships: Pt worries because his mother has a brain tumor. Financial / Lack of resources (include bankruptcy): None reported Housing / Lack of housing: Pt has stable housing Physical health (include injuries & life threatening diseases): None reported Social relationships: None reported Substance abuse: Pt denies any substance use. Bereavement / Loss: Both of pt's brothers are deceased.  Living/Environment/Situation:  Living Arrangements: Parent Living conditions (as described by patient or guardian): "Very great." Who else lives in the home?: Pt and his parents. How long has patient lived in current situation?: "All my life." What is atmosphere in current home: Comfortable, Loving, Supportive  Family History:  Marital status: Long term relationship Long term relationship, how long?: "A year and two months." What types of issues is patient dealing with in the relationship?: No issues reported. Additional relationship information: Girlfriend is supportive Are you sexually active?: Yes What is your sexual orientation?: Unable to assess Has your sexual activity been affected by drugs, alcohol, medication, or emotional stress?: Unable to assess Does patient have children?: Yes How many children?: 63 (A two-year-old daughter) How is patient's  relationship with their children?: Unknown at this time  Childhood History:  By whom was/is the patient raised?: Both parents Additional childhood history information: "I grew up riding dirt bikes, four wheelers, fishing, and hunting." Description of patient's relationship with caregiver when they were a child: "Good." Patient's description of current relationship with people who raised him/her: "Still good." How were you disciplined when you got in trouble as a child/adolescent?: Pt shares that he got whoopings but denies any abuse. Does patient have siblings?: Yes Number of Siblings: 3 (Two brothers (deceased) and a sister. Pt is the youngest.) Description of patient's current relationship with siblings: "It's fine." Did patient suffer any verbal/emotional/physical/sexual abuse as a child?: No Did patient suffer from severe childhood neglect?: No Has patient ever been sexually abused/assaulted/raped as an adolescent or adult?: No Was the patient ever a victim of a crime or a disaster?: No Witnessed domestic violence?: No Has patient been affected by domestic violence as an adult?: No  Education:  Highest grade of school patient has completed: High school graduate Currently a student?: No Learning disability?: Yes What learning problems does patient have?: Pt shares that he is unable to recall what his learning disability was but that he has some issues with reading and memory.  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 Has Patient ever Been in the [employer]?: No  Financial Resources:   Financial resources: Medicare Does patient have a representative payee or guardian?: Yes Name of representative payee or guardian: Marcelino Pellecchia,  mother/guardian, [phone]  Alcohol/Substance Abuse:   What has been  your use of drugs/alcohol within the last 12 months?: Pt denies any substance abuse. If attempted suicide, did drugs/alcohol play a role in this?: No Alcohol/Substance Abuse Treatment Hx: Denies past history If yes, describe treatment: N/A Has alcohol/substance abuse ever caused legal problems?: No  Social Support System:   Patient's Community Support System: Good Describe Community Support System: "I don't fool with a whole lot of people. I stay to myself." Pt does acknowledge his family being part of his support system. Type of faith/religion: "I believe in God. Be honest with you I haven't been to church in awhile." How does patient's faith help to cope with current illness?: "Pray."  Leisure/Recreation:   Do You Have Hobbies?: Yes Leisure and Hobbies: "Love to work out, ride Network engineer and four wheelers."  Strengths/Needs:   What is the patient's perception of their strengths?: "Good at working on Therapist, music with my hands." Patient states they can use these personal strengths during their treatment to contribute to their recovery: Unable to assess Patient states these barriers may affect/interfere with their treatment: Pt denies any barriers. Patient states these barriers may affect their return to the community: Pt denies any barriers. Other important information patient would like considered in planning for their treatment: N/A  Discharge Plan:   Currently receiving community mental health services: Yes (From Whom) ([facility] in [place], sees Dr. Janeece Riggers.) Patient states concerns and preferences for aftercare planning are: Pt would like to continue with his current provider but is also interested in therapy. Patient states they will know when they are safe and ready for discharge when: "I'm ready now." Does patient have access to transportation?: Yes Does patient have financial barriers related to discharge  medications?: Yes Patient description of barriers related to discharge medications: "Sometimes." Will patient be returning to same living situation after discharge?: Yes  Summary/Recommendations:   Summary and Recommendations (to be completed by the evaluator): Pt is a 27 year old, partnered, [place] [place] [facility] He stated that he came into the hospital because he "made a bad choice, bad decision." Pt arrived at the ED after having taken an intentional overdose on his psychiatric medication. He identified his goal as learning from the mistakes he's made in the past and making things better, being better.  Mother/guardian shared that pt overdosed due to the mother of his child telling him that he was nothing and was not a man because he would not leave his mother. During interview to complete assessment, stressors were identified as his mother having a brain tumor and pt looking for employment. Pt grew up in his childhood home with both parents. He has no history of trauma or abuse. Pt has stable housing and Medicare.  He has a primary diagnosis of Major Depressive Disorder, recurrent episode, severe.  He was being seen by Dr. Janeece Riggers of [facility]. The guardian is interested in looking at other options for continued psychiatric care. Pt is interested in being connected with therapy as well to continue with talk therapy. Recommendations include: crisis stabilization, therapeutic milieu, encourage group attendance and participation, medication management for mood stabilization and development of comprehensive mental wellness plan.  Glenis Smoker. 01/08/2023

## 2023-01-08 NOTE — Plan of Care (Signed)
  Problem: Education: Goal: Emotional status will improve Outcome: Progressing Goal: Mental status will improve Outcome: Progressing   Problem: Activity: Goal: Interest or engagement in activities will improve Outcome: Progressing   Problem: Coping: Goal: Ability to verbalize frustrations and anger appropriately will improve Outcome: Progressing   

## 2023-01-08 NOTE — Progress Notes (Signed)
   01/08/23 1000  Psych Admission Type (Psych Patients Only)  Admission Status Involuntary  Psychosocial Assessment  Patient Complaints None  Eye Contact Fair  Facial Expression Flat;Animated (animated at times)  Affect Appropriate to circumstance  Speech Logical/coherent  Interaction Assertive  Motor Activity Slow  Appearance/Hygiene Layered clothes  Behavior Characteristics Cooperative;Appropriate to situation  Mood Pleasant (patient states "I'm healthy as an Ox".)  Aggressive Behavior  Effect No apparent injury  Thought Process  Coherency WDL  Content WDL  Delusions None reported or observed  Perception WDL  Hallucination None reported or observed  Judgment WDL  Confusion WDL  Danger to Self  Current suicidal ideation? Denies  Agreement Not to Harm Self Yes  Description of Agreement Verbal  Danger to Others  Danger to Others None reported or observed

## 2023-01-08 NOTE — Group Note (Signed)
Date:  01/08/2023 Time:  9:46 PM  Group Topic/Focus:  Orientation:   The focus of this group is to educate the patient on the purpose and policies of crisis stabilization and provide a format to answer questions about their admission.  The group details unit policies and expectations of patients while admitted.    Participation Level:  Active  Participation Quality:  Appropriate and Attentive  Affect:  Appropriate  Cognitive:  Alert and Appropriate  Insight: Appropriate and Good  Engagement in Group:  Developing/Improving and Engaged  Modes of Intervention:  Clarification, Discussion, Orientation, Rapport Building, and Support  Additional Comments:     Omie Ferger 01/08/2023, 9:46 PM

## 2023-01-08 NOTE — Plan of Care (Signed)
  Problem: Education: Goal: Knowledge of [facility] Education information/materials will improve Outcome: Progressing Goal: Emotional status will improve Outcome: Progressing Goal: Mental status will improve Outcome: Progressing Goal: Verbalization of understanding the information provided will improve Outcome: Progressing   Problem: Activity: Goal: Interest or engagement in activities will improve Outcome: Progressing Goal: Sleeping patterns will improve Outcome: Progressing   Problem: Coping: Goal: Ability to verbalize frustrations and anger appropriately will improve Outcome: Progressing Goal: Ability to demonstrate self-control will improve Outcome: Progressing   Problem: Health Behavior/Discharge Planning: Goal: Identification of resources available to assist in meeting health care needs will improve Outcome: Progressing Goal: Compliance with treatment plan for underlying cause of condition will improve Outcome: Progressing   Problem: Physical Regulation: Goal: Ability to maintain clinical measurements within normal limits will improve Outcome: Progressing   Problem: Safety: Goal: Periods of time without injury will increase Outcome: Progressing   Problem: Education: Goal: Knowledge of [facility] Education information/materials will improve Outcome: Progressing Goal: Emotional status will improve Outcome: Progressing Goal: Mental status will improve Outcome: Progressing Goal: Verbalization of understanding the information provided will improve Outcome: Progressing   Problem: Activity: Goal: Interest or engagement in activities will improve Outcome: Progressing Goal: Sleeping patterns will improve Outcome: Progressing   Problem: Coping: Goal: Ability to verbalize frustrations and anger appropriately will improve Outcome: Progressing Goal: Ability to demonstrate self-control will improve Outcome: Progressing   Problem: Health  Behavior/Discharge Planning: Goal: Identification of resources available to assist in meeting health care needs will improve Outcome: Progressing Goal: Compliance with treatment plan for underlying cause of condition will improve Outcome: Progressing   Problem: Physical Regulation: Goal: Ability to maintain clinical measurements within normal limits will improve Outcome: Progressing   Problem: Safety: Goal: Periods of time without injury will increase Outcome: Progressing   Problem: Education: Goal: Ability to make informed decisions regarding treatment will improve Outcome: Progressing   Problem: Coping: Goal: Coping ability will improve Outcome: Progressing   Problem: Health Behavior/Discharge Planning: Goal: Identification of resources available to assist in meeting health care needs will improve Outcome: Progressing   Problem: Medication: Goal: Compliance with prescribed medication regimen will improve Outcome: Progressing   Problem: Self-Concept: Goal: Ability to disclose and discuss suicidal ideas will improve Outcome: Progressing

## 2023-01-08 NOTE — [facility] Suicide Risk Assessment (Signed)
[facility] INPATIENT:  Family/Significant Other Suicide Prevention Education  Suicide Prevention Education:  Education Completed; Renisha Hallisey/mother/guardian [phone]),  (name of family member/significant other) has been identified by the patient as the family member/significant other with whom the patient will be residing, and identified as the person(s) who will aid the patient in the event of a mental health crisis (suicidal ideations/suicide attempt).  With written consent from the patient, the family member/significant other has been provided the following suicide prevention education, prior to the and/or following the discharge of the patient.  The suicide prevention education provided includes the following: Suicide risk factors Suicide prevention and interventions National Suicide Hotline telephone number [facility] assessment telephone number [facility] Emergency Assistance 911 [facility] and/or Residential Mobile Crisis Unit telephone number  Request made of family/significant other to: Remove weapons (e.g., guns, rifles, knives), all items previously/currently identified as safety concern.   Remove drugs/medications (over-the-counter, prescriptions, illicit drugs), all items previously/currently identified as a safety concern.  The family member/significant other verbalizes understanding of the suicide prevention education information provided.  The family member/significant other agrees to remove the items of safety concern listed above.  Mother shares that pt took overdose in response to the mother of his child telling him he was nothing and was not a man because he would not leave his mother's home. Mother shares that pt struggles with anger issues, and grief around the loss of his brother. She denied feeling that pt is a danger to himself or others. During conversation, father was present and agreed to looking up weapons in the home. Mother shares that pt  has a history of really bad headaches and allergy issues that sometimes result in nosebleeds. She is not interested in pt returning to [facility] for continued psychiatric care. [place] also shares that pt expressed to her that he was interested in taking a shot instead of the pills to make sure that he actually takes his medication. CSW informed her that provider was updated regarding the shot. No other concerns expressed. Contact ended without incident.    Glenis Smoker 01/08/2023, 3:02 PM

## 2023-01-08 NOTE — H&P (Signed)
Psychiatric Admission Assessment Adult  Patient Identification: Larry Ball MRN:  [id number] Date of Evaluation:  01/08/2023 Chief Complaint:  MDD (major depressive disorder), recurrent episode, severe (HCC) [F33.2] Principal Diagnosis: MDD (major depressive disorder), recurrent episode, severe (HCC) Diagnosis:  Principal Problem:   MDD (major depressive disorder), recurrent episode, severe (HCC) Active Problems:   Suicide attempt by other psychotropic drug overdose (HCC)  History of Present Illness: 27 year old African American male with a past medical history of bipolar disorder, previously treated with Abilify (aripiprazole) and Latuda (lurasidone), who presented to the ED following an intentional overdose of 13 tablets of his prescribed psychotropic medication at approximately 7 PM. The patient reports feeling stressed, overwhelmed, and experiencing suicidal ideation leading to this act. He attributes his emotional state to several significant stressors, including:Caring for aging parents, including his mother diagnosed with an inoperable brain tumor, which has caused him significant emotional burden.Mourning the loss of his brother, which he has been unable to fully process. Ongoing conflict with his daughter's mother, characterized by frequent arguments, disparaging remarks, and financial disputes.The patient recalls feeling suicidal at the time of ingestion but reported relief upon realizing it was not fatal. He subsequently sought assistance from his parents, who called emergency services. The patient estimates that 10-30 minutes elapsed between taking the overdose and contacting first responders.Since the event, the patient states he feels "better now" but is unable to articulate the specific reasons for his improvement other than surviving the attempt. He denies current suicidal ideation, homicidal ideation, hallucinations, or delusions. His thought process is linear and concrete,  with no evidence of internal preoccupation. Associated Signs/Symptoms: Depression Symptoms:  insomnia, feelings of worthlessness/guilt, difficulty concentrating, hopelessness, impaired memory, suicidal attempt, loss of energy/fatigue, decreased labido, (Hypo) Manic Symptoms:  Impulsivity, Anxiety Symptoms:  Excessive Worry, Psychotic Symptoms:   none PTSD Symptoms: Negative Total Time spent with patient: 1.5 hours  Past Psychiatric History: Depression  Is the patient at risk to self? Yes.    Has the patient been a risk to self in the past 6 months? Yes.    Has the patient been a risk to self within the distant past? Yes.    Is the patient a risk to others? No.  Has the patient been a risk to others in the past 6 months? No.  Has the patient been a risk to others within the distant past? No.   [place] Scale:  Flowsheet Row Admission (Current) from 01/07/2023 in [facility] INPATIENT BEHAV[facility] MEDICINE ED from 01/05/2023 in [facility] Emergency Department at [facility] ED from 12/03/2021 in [facility] Emergency Department at [facility]  C-SSRS RISK CATEGORY High Risk High Risk No Risk        Prior Inpatient Therapy: No. If yes, describe none prior  Prior Outpatient Therapy: Yes.   If yes, describe [place]     Substance Abuse History in the last 12 months:  No. Consequences of Substance Abuse: Negative Previous Psychotropic Medications: Yes  Psychological Evaluations: Yes  Past Medical History:  Past Medical History:  Diagnosis Date   Asthma    Bipolar 1 disorder (HCC)     Past Surgical History:  Procedure Laterality Date   HERNIA REPAIR     KNEE SURGERY     Family History: History reviewed. No pertinent family history. Family Psychiatric  History: none reported Tobacco Screening:  Social History   Tobacco Use  Smoking Status Never  Smokeless Tobacco Never    BH Tobacco Counseling     Are you  interested in Tobacco Cessation Medications?   N/A, patient does not use tobacco products Counseled patient on smoking cessation:  N/A, patient does not use tobacco products Reason Tobacco Screening Not Completed: No value filed.       Social History:  Social History   Substance and Sexual Activity  Alcohol Use No     Social History   Substance and Sexual Activity  Drug Use No    Additional Social History:                           Allergies:   Allergies  Allergen Reactions   Promethazine Other (See Comments)    Unknown    Lab Results: No results found for this or any previous visit (from the past 48 hours).  Blood Alcohol level:  Lab Results  Component Value Date   ETH <10 01/06/2023    Metabolic Disorder Labs:  No results found for: "HGBA1C", "MPG" No results found for: "PROLACTIN" No results found for: "CHOL", "TRIG", "HDL", "CHOLHDL", "VLDL", "LDLCALC"  Current Medications: Current Facility-Administered Medications  Medication Dose Route Frequency Provider Last Rate Last Admin   acetaminophen (TYLENOL) tablet 650 mg  650 mg Oral Q6H PRN Chales Abrahams, NP       alum & mag hydroxide-simeth (MAALOX/MYLANTA) 200-200-20 MG/5ML suspension 30 mL  30 mL Oral Q6H PRN Chales Abrahams, NP       buPROPion [facility]) tablet 75 mg  75 mg Oral Daily [name], NP       haloperidol lactate (HALDOL) injection 5 mg  5 mg Intramuscular TID PRN Chales Abrahams, NP       And   diphenhydrAMINE (BENADRYL) injection 50 mg  50 mg Intramuscular TID PRN Chales Abrahams, NP       And   LORazepam (ATIVAN) injection 2 mg  2 mg Intramuscular TID PRN Ophelia Shoulder E, NP       divalproex (DEPAKOTE) DR tablet 250 mg  250 mg Oral Q12H [name], NP       hydrOXYzine (ATARAX) tablet 50 mg  50 mg Oral TID PRN [name], NP       ibuprofen (ADVIL) tablet 600 mg  600 mg Oral Q8H PRN Ophelia Shoulder E, NP       magnesium hydroxide (MILK OF MAGNESIA) suspension 30 mL  30 mL Oral Daily PRN Ophelia Shoulder E, NP        melatonin tablet 2.5 mg  2.5 mg Oral QHS [name], NP       ondansetron [facility]) tablet 4 mg  4 mg Oral Q8H PRN Chales Abrahams, NP       valACYclovir (VALTREX) tablet 1,000 mg  1,000 mg Oral Daily PRN Chales Abrahams, NP       PTA Medications: Medications Prior to Admission  Medication Sig Dispense Refill Last Dose/Taking   albuterol (VENTOLIN HFA) 108 (90 Base) MCG/ACT inhaler Inhale 2 puffs into the lungs every 4 (four) hours as needed for wheezing or shortness of breath. (Patient not taking: Reported on 01/06/2023) 1 each 1    ARIPiprazole (ABILIFY) 5 MG tablet Take 5 mg by mouth daily. (Patient not taking: Reported on 01/06/2023)      benzonatate (TESSALON PERLES) 100 MG capsule Take 1 capsule (100 mg total) by mouth every 6 (six) hours as needed for cough. (Patient not taking: Reported on 01/06/2023) 20 capsule 0    famotidine (PEPCID) 20  MG tablet Take 1 tablet (20 mg total) by mouth 2 (two) times daily. 60 tablet 1    GuanFACINE HCl (INTUNIV) 3 MG TB24 Take by mouth. (Patient not taking: Reported on 01/06/2023)      HYDROcodone-acetaminophen (NORCO) 5-325 MG tablet Take 1 tablet by mouth every 4 (four) hours as needed for moderate pain. (Patient not taking: Reported on 01/06/2023) 10 tablet 0    lisdexamfetamine (VYVANSE) 70 MG capsule Take 70 mg by mouth daily. (Patient not taking: Reported on 01/06/2023)      lithium 300 MG tablet Take 300 mg by mouth 3 (three) times daily. (Patient not taking: Reported on 01/06/2023)      loratadine (CLARITIN) 10 MG tablet Take 1 tablet (10 mg total) by mouth daily. 30 tablet 2    lurasidone (LATUDA) 20 MG TABS tablet Take 20 mg by mouth daily. (Patient not taking: Reported on 01/06/2023)      menthol-cetylpyridinium (CEPACOL REGULAR STRENGTH) 3 MG lozenge Take 1 lozenge (3 mg total) by mouth as needed for sore throat. (Patient not taking: Reported on 01/06/2023) 100 tablet 12    ondansetron (ZOFRAN-ODT) 4 MG disintegrating tablet Take 1 tablet (4  mg total) by mouth every 8 (eight) hours as needed for nausea or vomiting. (Patient not taking: Reported on 01/06/2023) 20 tablet 0    valACYclovir (VALTREX) 1000 MG tablet Take 1,000 mg by mouth daily as needed (flare).       Musculoskeletal: Strength & Muscle Tone: within normal limits Gait & Station: normal Patient leans: N/A            Psychiatric Specialty Exam:  Presentation  General Appearance:  Fairly Groomed  Eye Contact: Good  Speech: Clear and Coherent; Normal Rate  Speech Volume: Normal  Handedness: Right   Mood and Affect  Mood: Depressed  Affect: Flat   Thought Process  Thought Processes: Coherent  Duration of Psychotic Symptoms: Depressive Symptoms  Past Diagnosis of Schizophrenia or Psychoactive disorder: No  Descriptions of Associations:Intact  Orientation:Full (Time, Place and Person)  Thought Content:Rumination  Hallucinations:Hallucinations: None  Ideas of Reference:None  Suicidal Thoughts:Suicidal Thoughts: No  Homicidal Thoughts:Homicidal Thoughts: No   Sensorium  Memory: Immediate Good; Remote Good  Judgment: Poor  Insight: Poor   Executive Functions  Concentration: Good  Attention Span: Fair  Recall: Good  Fund of Knowledge: Good  Language: Good   Psychomotor Activity  Psychomotor Activity: Psychomotor Activity: Normal   Assets  Assets: Communication Skills; Housing   Sleep  Sleep: Sleep: Fair Number of Hours of Sleep: 5    Physical Exam: Physical Exam Vitals and nursing note reviewed.  Constitutional:      Appearance: Normal appearance.  HENT:     Head: Normocephalic and atraumatic.     Nose: Nose normal.  Pulmonary:     Effort: Pulmonary effort is normal.  Musculoskeletal:        General: Normal range of motion.     Cervical back: Normal range of motion.  Neurological:     General: No focal deficit present.     Mental Status: He is alert and oriented to person,  place, and time. Mental status is at baseline.  Psychiatric:        Attention and Perception: Attention and perception normal.        Mood and Affect: Mood is depressed. Affect is flat.        Speech: Speech normal.        Behavior: Behavior normal. Behavior is cooperative.  Thought Content: Thought content normal.        Cognition and Memory: Cognition normal.        Judgment: Judgment is impulsive.    ROS Blood pressure 125/60, pulse 89, temperature 98 F (36.7 C), temperature source Oral, resp. rate 16, height 5\' 10"  (1.778 m), weight 104.3 kg, SpO2 100%. Body mass index is 32.99 kg/m.  Treatment Plan Summary: Daily contact with patient to assess and evaluate symptoms and progress in treatment and Medication management  Observation Level/Precautions:  Continuous Observation 15 minute checks Seizure  Laboratory:  GGT LIPIDS  Psychotherapy:    Medications:    Consultations:    Discharge Concerns:    Estimated LOS:  Other:     Physician Treatment Plan for Primary Diagnosis: MDD (major depressive disorder), recurrent episode, severe (HCC) Long Term Goal(s): Improvement in symptoms so as ready for discharge  Short Term Goals: Ability to identify changes in lifestyle to reduce recurrence of condition will improve, Ability to verbalize feelings will improve, Ability to disclose and discuss suicidal ideas, Ability to demonstrate self-control will improve, Ability to identify and develop effective coping behaviors will improve, Ability to maintain clinical measurements within normal limits will improve, Compliance with prescribed medications will improve, and Ability to identify triggers associated with substance abuse/mental health issues will improve  Physician Treatment Plan for Secondary Diagnosis: Principal Problem:   MDD (major depressive disorder), recurrent episode, severe (HCC) Active Problems:   Suicide attempt by other psychotropic drug overdose (HCC)  Long Term  Goal(s): Improvement in symptoms so as ready for discharge  Short Term Goals: Ability to identify changes in lifestyle to reduce recurrence of condition will improve, Ability to verbalize feelings will improve, Ability to disclose and discuss suicidal ideas, Ability to demonstrate self-control will improve, Ability to identify and develop effective coping behaviors will improve, Ability to maintain clinical measurements within normal limits will improve, Compliance with prescribed medications will improve, and Ability to identify triggers associated with substance abuse/mental health issues will improve  I certify that inpatient services furnished can reasonably be expected to improve the patient's condition.    [name], NP 12/27/202411:28 AM

## 2023-01-08 NOTE — Group Note (Signed)
Recreation Therapy Group Note   Group Topic:Leisure Education  Group Date: 01/08/2023 Start Time: 1000 End Time: 1100 Facilitators: Rosina Lowenstein, LRT, CTRS Location:  Craft Room  Group Description: Leisure. Patients were given the option to choose from singing karaoke, coloring mandalas, using oil pastels, journaling, or playing with play-doh. LRT and pts discussed the meaning of leisure, the importance of participating in leisure during their free time/when they're outside of the hospital, as well as how our leisure interests can also serve as coping skills.   Goal Area(s) Addressed:  Patient will identify a current leisure interest.  Patient will learn the definition of "leisure". Patient will practice making a positive decision. Patient will have the opportunity to try a new leisure activity. Patient will communicate with peers and LRT.    Affect/Mood: Appropriate   Participation Level: Active and Engaged   Participation Quality: Independent   Behavior: Appropriate, Calm, and Cooperative   Speech/Thought Process: Coherent   Insight: Fair   Judgement: Fair    Modes of Intervention: Activity, Education, Exploration, Guided Discussion, and Music   Patient Response to Interventions:  Attentive, Engaged, and Receptive   Education Outcome:  Acknowledges education   Clinical Observations/Individualized Feedback: Larry Ball was active in their participation of session activities and group discussion. Pt identified "go to the gym and ride my dirt bike" as things he does in his free time. Pt chose to color with markers while in group. Pt interacted well with LRT and peers duration of session.    Plan: Continue to engage patient in RT group sessions 2-3x/week.   Rosina Lowenstein, LRT, CTRS 01/08/2023 1:08 PM

## 2023-01-09 DIAGNOSIS — F312 Bipolar disorder, current episode manic severe with psychotic features: Secondary | ICD-10-CM | POA: Diagnosis not present

## 2023-01-09 LAB — LIPID PANEL
Cholesterol: 178 mg/dL (ref 0–200)
HDL: 32 mg/dL — ABNORMAL LOW (ref >40)
LDL Cholesterol: 135 mg/dL — ABNORMAL HIGH (ref 0–99)
Total CHOL/HDL Ratio: 5.6 {ratio}
Triglycerides: 54 mg/dL (ref <150)
VLDL: 11 mg/dL (ref 0–40)

## 2023-01-09 MED ORDER — ARIPIPRAZOLE ER 400 MG IM SRER
300.0000 mg | INTRAMUSCULAR | Status: DC
  Administered 2023-01-10: 300 mg via INTRAMUSCULAR
  Filled 2023-01-09: qty 2

## 2023-01-09 MED ORDER — ARIPIPRAZOLE 2 MG PO TABS
2.0000 mg | ORAL_TABLET | Freq: Every day | ORAL | Status: DC
  Administered 2023-01-09 – 2023-01-11 (×3): 2 mg via ORAL
  Filled 2023-01-09 (×3): qty 1

## 2023-01-09 NOTE — Group Note (Signed)
LCSW Group Therapy Note   Group Date: 01/09/2023 Start Time: 1300 End Time: 1408   Type of Therapy and Topic:  Group Therapy: Challenging Core Beliefs  Participation Level:  Active  Description of Group:  Patients were educated about core beliefs and asked to identify one harmful core belief that they have. Patients were asked to explore from where those beliefs originate. Patients were asked to discuss how those beliefs make them feel and the resulting behaviors of those beliefs. They were then be asked if those beliefs are true and, if so, what evidence they have to support them. Lastly, group members were challenged to replace those negative core beliefs with helpful beliefs.   Therapeutic Goals:   1. Patient will identify harmful core beliefs and explore the origins of such beliefs. 2. Patient will identify feelings and behaviors that result from those core beliefs. 3. Patient will discuss whether such beliefs are true. 4.  Patient will replace harmful core beliefs with helpful ones.  Summary of Patient Progress:  Patient actively engaged in processing and exploring how core beliefs are formed and how they impact thoughts, feelings, and behaviors. Patient proved open to input from peers and feedback from CSW. Patient demonstrated proficient insight into the subject matter, was respectful and supportive of peers, and participated throughout the entire session.  Therapeutic Modalities: Cognitive Behavioral Therapy; Solution-Focused Therapy   Lowry Ram, Theresia Majors 01/09/2023  2:11 PM

## 2023-01-09 NOTE — Plan of Care (Signed)
  Problem: Education: Goal: Knowledge of [facility] Education information/materials will improve Outcome: Progressing Goal: Emotional status will improve Outcome: Progressing Goal: Mental status will improve Outcome: Progressing Goal: Verbalization of understanding the information provided will improve Outcome: Progressing   

## 2023-01-09 NOTE — Progress Notes (Signed)
D: Patient alert and oriented, able to make needs known. Denies SI/HI, AVH at present. Denies pain at present. Patient goal today "to do better in life." Rates depression 2/10, hopelessness 2/10, and anxiety 0/10. Patient reports energy level as normal. He reports he slept good last night. Patient does not request any PRN medication at this time.   A: Scheduled medications administered to patient per MD order. Support and encouragement provided. Routine safety checks conducted every fifteen minutes. Patient informed to notify staff with problems or concerns. Frequent verbal contact made.   R: No adverse drug reactions noted. Patient contracts for safety at this time. Patient is compliant with medications and treatment plan. Patient receptive, calm and cooperative. Patient interacts with others appropriately on unit at present. Patient remains safe at present.

## 2023-01-09 NOTE — Progress Notes (Signed)
[facility] MD Progress Note  01/09/2023 1:14 PM Larry Ball  MRN:  [id number] Subjective:   27 year old African American male who inquires, "Is there a shot that can help me with my depression?" The patient expresses interest in long-acting injectable (LAI) treatment for managing his symptoms. He denies suicidal ideation (SI), homicidal ideation (HI), and auditory or visual hallucinations (AVH). The patient appears motivated and engaged in seeking effective treatment options.The patient presents with depressive symptoms and a desire for long-term treatment management. He is interested in transitioning to a long-acting injectable (LAI) formulation of Abilify (aripiprazole), which requires 2 weeks of concurrent oral medication. There are no immediate safety concerns, and the patient is agreeable to the proposed treatment plan. Principal Problem: MDD (major depressive disorder), recurrent episode, severe (HCC) Diagnosis: Principal Problem:   MDD (major depressive disorder), recurrent episode, severe (HCC) Active Problems:   Suicide attempt by other psychotropic drug overdose (HCC)  Total Time spent with patient: 1 hour  Past Psychiatric History: see below  Past Medical History:  Past Medical History:  Diagnosis Date   Asthma    Bipolar 1 disorder (HCC)     Past Surgical History:  Procedure Laterality Date   HERNIA REPAIR     KNEE SURGERY     Family History: History reviewed. No pertinent family history. Family Psychiatric  History: none reported Social History:  Social History   Substance and Sexual Activity  Alcohol Use No     Social History   Substance and Sexual Activity  Drug Use No    Social History   Socioeconomic History   Marital status: Single    Spouse name: Not on file   Number of children: Not on file   Years of education: Not on file   Highest education level: Not on file  Occupational History   Not on file  Tobacco Use   Smoking status: Never    Smokeless tobacco: Never  Vaping Use   Vaping status: Never Used  Substance and Sexual Activity   Alcohol use: No   Drug use: No   Sexual activity: Yes  Other Topics Concern   Not on file  Social History Narrative   Not on file   Social Drivers of Health   Financial Resource Strain: Low Risk  (03/31/2022)   Received from [facility]   Overall Financial Resource Strain (CARDIA)    Difficulty of Paying Living Expenses: Not hard at all  Food Insecurity: No Food Insecurity (01/07/2023)   Hunger Vital Sign    Worried About Running Out of Food in the Last Year: Never true    Ran Out of Food in the Last Year: Never true  Transportation Needs: No Transportation Needs (01/07/2023)   PRAPARE - Administrator, Civil Service (Medical): No    Lack of Transportation (Non-Medical): No  Physical Activity: Not on file  Stress: Not on file  Social Connections: Not on file   Additional Social History:                         Sleep: Good  Appetite:  Good  Current Medications: Current Facility-Administered Medications  Medication Dose Route Frequency Provider Last Rate Last Admin   acetaminophen (TYLENOL) tablet 650 mg  650 mg Oral Q6H PRN Chales Abrahams, NP       alum & mag hydroxide-simeth (MAALOX/MYLANTA) 200-200-20 MG/5ML suspension 30 mL  30 mL Oral Q6H PRN Arvilla Market,  Gibson Ramp, NP       buPROPion [facility]) tablet 75 mg  75 mg Oral Daily [name], NP   75 mg at 01/09/23 6213   haloperidol lactate (HALDOL) injection 5 mg  5 mg Intramuscular TID PRN Chales Abrahams, NP       And   diphenhydrAMINE (BENADRYL) injection 50 mg  50 mg Intramuscular TID PRN Chales Abrahams, NP       And   LORazepam (ATIVAN) injection 2 mg  2 mg Intramuscular TID PRN Chales Abrahams, NP       divalproex (DEPAKOTE) DR tablet 250 mg  250 mg Oral Q12H [name], NP   250 mg at 01/09/23 0865   hydrOXYzine (ATARAX) tablet 50 mg  50 mg  Oral TID PRN [name], NP       ibuprofen (ADVIL) tablet 600 mg  600 mg Oral Q8H PRN Chales Abrahams, NP       magnesium hydroxide (MILK OF MAGNESIA) suspension 30 mL  30 mL Oral Daily PRN Chales Abrahams, NP       melatonin tablet 2.5 mg  2.5 mg Oral QHS [name], NP   2.5 mg at 01/08/23 2053   ondansetron (ZOFRAN) tablet 4 mg  4 mg Oral Q8H PRN Chales Abrahams, NP       valACYclovir (VALTREX) tablet 1,000 mg  1,000 mg Oral Daily PRN Chales Abrahams, NP        Lab Results:  Results for orders placed or performed during the hospital encounter of 01/07/23 (from the past 48 hours)  Lipid panel     Status: Abnormal   Collection Time: 01/09/23  8:35 AM  Result Value Ref Range   Cholesterol 178 0 - 200 mg/dL   Triglycerides 54 <784 mg/dL   HDL 32 (L) >69 mg/dL   Total CHOL/HDL Ratio 5.6 RATIO   VLDL 11 0 - 40 mg/dL   LDL Cholesterol 629 (H) 0 - 99 mg/dL    Comment:        Total Cholesterol/HDL:CHD Risk Coronary Heart Disease Risk Table                     Men   Women  1/2 Average Risk   3.4   3.3  Average Risk       5.0   4.4  2 X Average Risk   9.6   7.1  3 X Average Risk  23.4   11.0        Use the calculated Patient Ratio above and the CHD Risk Table to determine the patient's CHD Risk.        ATP III CLASSIFICATION (LDL):  <100     mg/dL   Optimal  528-413  mg/dL   Near or Above                    Optimal  130-159  mg/dL   Borderline  244-010  mg/dL   High  >272     mg/dL   Very High Performed at [facility], [address] [place], [place] [address]     Blood Alcohol level:  Lab Results  Component Value Date   [facility] <10 01/06/2023    Metabolic Disorder Labs: No results found for: "HGBA1C", "MPG" No results found for: "PROLACTIN" Lab Results  Component Value Date   CHOL 178 01/09/2023   TRIG 54 01/09/2023   HDL 32 (  L) 01/09/2023   CHOLHDL 5.6 01/09/2023   VLDL 11 01/09/2023   LDLCALC 135 (H) 01/09/2023    Physical Findings: AIMS:  , ,   ,  ,    CIWA:    COWS:     Musculoskeletal: Strength & Muscle Tone: within normal limits Gait & Station: normal Patient leans: N/A  Psychiatric Specialty Exam:  Presentation  General Appearance:  Neat; Appropriate for Environment  Eye Contact: Good  Speech: Clear and Coherent; Normal Rate  Speech Volume: Normal  Handedness: Right   Mood and Affect  Mood: Anxious  Affect: Flat   Thought Process  Thought Processes: Coherent  Descriptions of Associations:Intact  Orientation:Full (Time, Place and Person) (and situation)  Thought Content:Rumination  History of Schizophrenia/Schizoaffective disorder:No  Duration of Psychotic Symptoms:No data recorded Hallucinations:Hallucinations: None  Ideas of Reference:None  Suicidal Thoughts:Suicidal Thoughts: No (denies)  Homicidal Thoughts:Homicidal Thoughts: No (denies)   Sensorium  Memory: Immediate Fair; Remote Fair  Judgment: Fair  Insight: Fair   Art therapist  Concentration: Good  Attention Span: Fair  Recall: Fair  Fund of Knowledge: Good  Language: Good   Psychomotor Activity  Psychomotor Activity: Psychomotor Activity: Normal   Assets  Assets: Communication Skills; Housing; Social Support; Desire for Improvement   Sleep  Sleep: Sleep: Good Number of Hours of Sleep: 7    Physical Exam: Physical Exam Vitals and nursing note reviewed.  Constitutional:      Appearance: Normal appearance.  HENT:     Head: Normocephalic and atraumatic.     Nose: Nose normal.  Pulmonary:     Effort: Pulmonary effort is normal.  Musculoskeletal:        General: Normal range of motion.     Cervical back: Normal range of motion.  Neurological:     General: No focal deficit present.     Mental Status: He is alert and oriented to person, place, and time. Mental status is at baseline.  Psychiatric:        Attention and Perception: Attention and perception normal.        Mood and  Affect: Mood is anxious. Affect is flat.        Speech: Speech normal.        Behavior: Behavior normal. Behavior is cooperative.        Thought Content: Thought content normal.        Cognition and Memory: Cognition and memory normal.        Judgment: Judgment normal.    Review of Systems  Psychiatric/Behavioral:  The patient is nervous/anxious.   All other systems reviewed and are negative.  Blood pressure 110/61, pulse 88, temperature 98.6 F (37 C), resp. rate 16, height 5\' 10"  (1.778 m), weight 104.3 kg, SpO2 98%. Body mass index is 32.99 kg/m.   Treatment Plan Summary: Daily contact with patient to assess and evaluate symptoms and progress in treatment and Medication management Abilify Maintena 300 mg IM injection to be administered once monthly. Patient informed about the need to take oral aripiprazole concurrently for 2 weeks after the first injection to ensure therapeutic coverage. Reinforced the importance of adhering to the oral medication schedule during the transition phase. Depakote ER 500 mg nightly for mood stabilization. Monitor therapeutic levels. Trazodone 50 mg nightly as needed for sleep, considering the patient's stress-related insomnia. Bupropion SR 150 mg low energy and motivation. Initiate individual psychotherapy with a focus on stress management, grief counseling, and conflict resolution Monitor for withdrawal symptoms after discontinuing Abilify Lanette Hampshire  Earlene Plater, NP 01/09/2023, 1:14 PM

## 2023-01-09 NOTE — Group Note (Signed)
Date:  01/09/2023 Time:  9:36 AM  Group Topic/Focus:  Managing Feelings:   The focus of this group is to identify what feelings patients have difficulty handling and develop a plan to handle them in a healthier way upon discharge.    Participation Level:  Did Not Attend  Larry Ball [name] 01/09/2023, 9:36 AM

## 2023-01-10 DIAGNOSIS — F312 Bipolar disorder, current episode manic severe with psychotic features: Secondary | ICD-10-CM | POA: Diagnosis not present

## 2023-01-10 NOTE — Group Note (Signed)
Date:  01/10/2023 Time:  11:59 AM  Group Topic/Focus:  Overcoming Stress:   The focus of this group is to define stress and help patients assess their triggers.    Participation Level:  Active  Participation Quality:  Appropriate  Affect:  Appropriate  Cognitive:  Appropriate  Insight: Appropriate  Engagement in Group:  Engaged  Modes of Intervention:  Activity  Additional Comments:    Larry Ball 01/10/2023, 11:59 AM

## 2023-01-10 NOTE — Progress Notes (Signed)
°   01/10/23 1025  Psych Admission Type (Psych Patients Only)  Admission Status Involuntary  Psychosocial Assessment  Patient Complaints Worrying  Eye Contact Fair  Facial Expression Flat  Affect Flat  Speech Logical/coherent  Interaction Assertive  Motor Activity Slow  Appearance/Hygiene Layered clothes  Behavior Characteristics Cooperative  Mood Pleasant  Thought Process  Coherency WDL  Content WDL  Delusions None reported or observed  Perception WDL  Hallucination None reported or observed  Judgment WDL  Confusion None  Danger to Self  Current suicidal ideation? Denies  Agreement Not to Harm Self Yes  Description of Agreement verbal  Danger to Others  Danger to Others None reported or observed  Danger to Others Abnormal  Harmful Behavior to others No threats or harm toward other people

## 2023-01-10 NOTE — Group Note (Signed)
[facility] LCSW Group Therapy Note   Group Date: 01/10/2023 Start Time: 1315 End Time: 1400   Type of Therapy and Topic: Group Therapy: Avoiding Self-Sabotaging and Enabling Behaviors  Participation Level: Active  Mood: Pleasant  Description of Group:  In this group, patients will learn how to identify obstacles, self-sabotaging and enabling behaviors, as well as: what are they, why do we do them and what needs these behaviors meet. Discuss unhealthy relationships and how to have positive healthy boundaries with those that sabotage and enable. Explore aspects of self-sabotage and enabling in yourself and how to limit these self-destructive behaviors in everyday life.   Therapeutic Goals: 1. Patient will identify one obstacle that relates to self-sabotage and enabling behaviors 2. Patient will identify one personal self-sabotaging or enabling behavior they did prior to admission 3. Patient will state a plan to change the above identified behavior 4. Patient will demonstrate ability to communicate their needs through discussion and/or role play.    Summary of Patient Progress:   Patient participated throughout group. He had good insight and was observed interacting positively with group members and facilitator. Therapeutic goals were achieved.   Therapeutic Modalities:  Cognitive Behavioral Therapy Person-Centered Therapy Motivational Interviewing    Bridgett Larsson, LCSW

## 2023-01-10 NOTE — Progress Notes (Signed)
[facility] MD Progress Note  01/10/2023 2:55 PM Larry Ball  MRN:  [id number] Subjective:  27 year old African American male who reports, "I went to sleep and slept great after getting my shot." He was administered Abilify Maintena 300 mg IM for mood stabilization as per his request. The patient expresses satisfaction with the medication and denies any adverse effects or concerns following the injection.Patient is adherent and actively engaged in treatment, requesting continuation of long-acting injectable for stability.No signs of acute psychosis, agitation, or mood instability observe.Reports significant improvement in sleep post-injection Principal Problem: Bipolar affective disorder, current episode manic with psychotic symptoms (HCC) Diagnosis: Principal Problem:   Bipolar affective disorder, current episode manic with psychotic symptoms (HCC) Active Problems:   Suicide attempt by other psychotropic drug overdose (HCC)  Total Time spent with patient: 1.5 hours  Past Psychiatric History: see below  Past Medical History:  Past Medical History:  Diagnosis Date   Asthma    Bipolar 1 disorder (HCC)     Past Surgical History:  Procedure Laterality Date   HERNIA REPAIR     KNEE SURGERY     Family History: History reviewed. No pertinent family history. Family Psychiatric  History: none reported Social History:  Social History   Substance and Sexual Activity  Alcohol Use No     Social History   Substance and Sexual Activity  Drug Use No    Social History   Socioeconomic History   Marital status: Single    Spouse name: Not on file   Number of children: Not on file   Years of education: Not on file   Highest education level: Not on file  Occupational History   Not on file  Tobacco Use   Smoking status: Never   Smokeless tobacco: Never  Vaping Use   Vaping status: Never Used  Substance and Sexual Activity   Alcohol use: No   Drug use: No   Sexual activity: Yes   Other Topics Concern   Not on file  Social History Narrative   Not on file   Social Drivers of Health   Financial Resource Strain: Low Risk  (03/31/2022)   Received from [facility], [facility]   Overall Financial Resource Strain (CARDIA)    Difficulty of Paying Living Expenses: Not hard at all  Food Insecurity: No Food Insecurity (01/07/2023)   Hunger Vital Sign    Worried About Running Out of Food in the Last Year: Never true    Ran Out of Food in the Last Year: Never true  Transportation Needs: No Transportation Needs (01/07/2023)   PRAPARE - Administrator, Civil Service (Medical): No    Lack of Transportation (Non-Medical): No  Physical Activity: Not on file  Stress: Not on file  Social Connections: Not on file   Additional Social History:                         Sleep: Good  Appetite:  Good  Current Medications: Current Facility-Administered Medications  Medication Dose Route Frequency Provider Last Rate Last Admin   acetaminophen (TYLENOL) tablet 650 mg  650 mg Oral Q6H PRN Chales Abrahams, NP       alum & mag hydroxide-simeth (MAALOX/MYLANTA) 200-200-20 MG/5ML suspension 30 mL  30 mL Oral Q6H PRN Ophelia Shoulder E, NP       ARIPiprazole (ABILIFY) tablet 2 mg  2 mg Oral Daily [name], NP  2 mg at 01/10/23 0830   ARIPiprazole ER (ABILIFY MAINTENA) injection 300 mg  300 mg Intramuscular Q28 days [name], NP   300 mg at 01/10/23 0030   buPROPion [facility]) tablet 75 mg  75 mg Oral Daily [name], NP   75 mg at 01/10/23 0830   haloperidol lactate (HALDOL) injection 5 mg  5 mg Intramuscular TID PRN Chales Abrahams, NP       And   diphenhydrAMINE (BENADRYL) injection 50 mg  50 mg Intramuscular TID PRN Chales Abrahams, NP       And   LORazepam (ATIVAN) injection 2 mg  2 mg Intramuscular TID PRN Chales Abrahams, NP       divalproex (DEPAKOTE) DR tablet 250 mg  250 mg Oral Q12H [name], NP    250 mg at 01/10/23 0830   hydrOXYzine (ATARAX) tablet 50 mg  50 mg Oral TID PRN [name], NP       ibuprofen (ADVIL) tablet 600 mg  600 mg Oral Q8H PRN Chales Abrahams, NP       magnesium hydroxide (MILK OF MAGNESIA) suspension 30 mL  30 mL Oral Daily PRN Chales Abrahams, NP       melatonin tablet 2.5 mg  2.5 mg Oral QHS [name], NP   2.5 mg at 01/09/23 2129   ondansetron (ZOFRAN) tablet 4 mg  4 mg Oral Q8H PRN Chales Abrahams, NP       valACYclovir (VALTREX) tablet 1,000 mg  1,000 mg Oral Daily PRN Chales Abrahams, NP        Lab Results:  Results for orders placed or performed during the hospital encounter of 01/07/23 (from the past 48 hours)  Lipid panel     Status: Abnormal   Collection Time: 01/09/23  8:35 AM  Result Value Ref Range   Cholesterol 178 0 - 200 mg/dL   Triglycerides 54 <161 mg/dL   HDL 32 (L) >09 mg/dL   Total CHOL/HDL Ratio 5.6 RATIO   VLDL 11 0 - 40 mg/dL   LDL Cholesterol 604 (H) 0 - 99 mg/dL    Comment:        Total Cholesterol/HDL:CHD Risk Coronary Heart Disease Risk Table                     Men   Women  1/2 Average Risk   3.4   3.3  Average Risk       5.0   4.4  2 X Average Risk   9.6   7.1  3 X Average Risk  23.4   11.0        Use the calculated Patient Ratio above and the CHD Risk Table to determine the patient's CHD Risk.        ATP III CLASSIFICATION (LDL):  <100     mg/dL   Optimal  540-981  mg/dL   Near or Above                    Optimal  130-159  mg/dL   Borderline  191-478  mg/dL   High  >295     mg/dL   Very High Performed at [facility], [address] [place], [place] [address]     Blood Alcohol level:  Lab Results  Component Value Date   [facility] <10 01/06/2023    Metabolic Disorder Labs: No results found for: "HGBA1C", "MPG" No  results found for: "PROLACTIN" Lab Results  Component Value Date   CHOL 178 01/09/2023   TRIG 54 01/09/2023   HDL 32 (L) 01/09/2023   CHOLHDL 5.6 01/09/2023   VLDL 11 01/09/2023    LDLCALC 135 (H) 01/09/2023    Physical Findings: AIMS:  , ,  ,  ,    CIWA:    COWS:     Musculoskeletal: Strength & Muscle Tone: within normal limits Gait & Station: normal Patient leans: N/A  Psychiatric Specialty Exam:  Presentation  General Appearance:  Appropriate for Environment  Eye Contact: Good  Speech: Clear and Coherent; Normal Rate  Speech Volume: Normal  Handedness: Right   Mood and Affect  Mood: Anxious  Affect: Appropriate; Full Range   Thought Process  Thought Processes: Coherent; Goal Directed  Descriptions of Associations:Intact  Orientation:Full (Time, Place and Person) (and situation)  Thought Content:WDL  History of Schizophrenia/Schizoaffective disorder:No  Duration of Psychotic Symptoms:No data recorded Hallucinations:Hallucinations: None  Ideas of Reference:None  Suicidal Thoughts:Suicidal Thoughts: No  Homicidal Thoughts:Homicidal Thoughts: No   Sensorium  Memory: Immediate Good; Remote Good  Judgment: Fair  Insight: Fair   Art therapist  Concentration: Good  Attention Span: Fair  Recall: Good  Fund of Knowledge: Good  Language: Good   Psychomotor Activity  Psychomotor Activity: Psychomotor Activity: Normal   Assets  Assets: Communication Skills; Housing; Social Support; Physical Health   Sleep  Sleep: Sleep: Good Number of Hours of Sleep: 7    Physical Exam: Physical Exam Vitals and nursing note reviewed.  HENT:     Head: Normocephalic and atraumatic.     Nose: Nose normal.  Pulmonary:     Effort: Pulmonary effort is normal.  Musculoskeletal:        General: Normal range of motion.     Cervical back: Normal range of motion.  Neurological:     General: No focal deficit present.     Mental Status: He is oriented to person, place, and time. Mental status is at baseline.  Psychiatric:        Attention and Perception: Attention and perception normal.        Mood and  Affect: Affect normal. Mood is anxious.        Speech: Speech normal.        Behavior: Behavior normal. Behavior is cooperative.        Thought Content: Thought content normal.        Cognition and Memory: Cognition and memory normal.        Judgment: Judgment normal.    Review of Systems  Psychiatric/Behavioral:  The patient is nervous/anxious.   All other systems reviewed and are negative.  Blood pressure 122/67, pulse 81, temperature 98.4 F (36.9 C), resp. rate (!) 21, height 5\' 10"  (1.778 m), weight 104.3 kg, SpO2 97%. Body mass index is 32.99 kg/m.   Treatment Plan Summary: Daily contact with patient to assess and evaluate symptoms and progress in treatment and Medication management Abilify Maintena 300 mg IM injection to be administered once monthly. Patient informed about the need to take oral aripiprazole concurrently for 2 weeks after the first injection to ensure therapeutic coverage. Reinforced the importance of adhering to the oral medication schedule during the transition phase. Depakote ER 500 mg nightly for mood stabilization. Monitor therapeutic levels. Trazodone 50 mg nightly as needed for sleep, considering the patient's stress-related insomnia. Bupropion SR 150 mg low energy and motivation. Initiate individual psychotherapy with a focus on stress management, grief counseling,  and conflict resolution Monitor for withdrawal symptoms after discontinuing Abilify [name], NP 01/10/2023, 2:55 PM

## 2023-01-10 NOTE — Group Note (Signed)
Date:  01/10/2023 Time:  10:23 PM  Group Topic/Focus:  Wrap-Up Group:   The focus of this group is to help patients review their daily goal of treatment and discuss progress on daily workbooks.    Participation Level:  Active  Participation Quality:  Appropriate and Attentive  Affect:  Appropriate  Cognitive:  Alert and Appropriate  Insight: Appropriate, Good, and Improving  Engagement in Group:  Developing/Improving and Engaged  Modes of Intervention:  Clarification, Discussion, Rapport Building, and Support  Additional Comments:     Larry Ball 01/10/2023, 10:23 PM

## 2023-01-10 NOTE — Plan of Care (Signed)
  Problem: Education: Goal: Verbalization of understanding the information provided will improve Outcome: Progressing   Problem: Education: Goal: Mental status will improve Outcome: Progressing   Problem: Education: Goal: Emotional status will improve Outcome: Progressing

## 2023-01-11 DIAGNOSIS — F312 Bipolar disorder, current episode manic severe with psychotic features: Secondary | ICD-10-CM | POA: Diagnosis not present

## 2023-01-11 MED ORDER — ARIPIPRAZOLE ER 400 MG IM SRER: 300.0000 mg | INTRAMUSCULAR | 0 refills | Status: AC

## 2023-01-11 MED ORDER — ARIPIPRAZOLE 2 MG PO TABS: 2.0000 mg | ORAL_TABLET | Freq: Every day | ORAL | 0 refills | Status: DC

## 2023-01-11 MED ORDER — BUPROPION HCL 75 MG PO TABS: 75.0000 mg | ORAL_TABLET | Freq: Every day | ORAL | 0 refills | Status: DC

## 2023-01-11 MED ORDER — MELATONIN 5 MG PO TABS: 2.5000 mg | ORAL_TABLET | Freq: Every day | ORAL | 0 refills | Status: AC

## 2023-01-11 MED ORDER — DIVALPROEX SODIUM 250 MG PO DR TAB: 250.0000 mg | DELAYED_RELEASE_TABLET | Freq: Two times a day (BID) | ORAL | 0 refills | Status: DC

## 2023-01-11 NOTE — Plan of Care (Signed)
  Problem: Education: Goal: Mental status will improve Outcome: Progressing   Problem: Education: Goal: Emotional status will improve Outcome: Progressing   Problem: Activity: Goal: Interest or engagement in activities will improve Outcome: Progressing

## 2023-01-11 NOTE — Discharge Summary (Signed)
Physician Discharge Summary Note  Patient:  Larry Ball is an 27 y.o., male MRN:  [id number] DOB:  1995/09/27 Patient phone:  [phone] (home)  Patient address:   [address] [place] [address],  Total Time spent with patient: 1.5 hours  Date of Admission:  01/07/2023 Date of Discharge: 01/11/2023  Reason for Admission:   27 year old African American male, was admitted for evaluation and treatment following a suicide attempt via intentional ingestion. This act, along with a history of depressive and psychotic symptoms, significant psychosocial stressors, and reports of auditory hallucinations, necessitated intensive inpatient psychiatric care. The patient exhibited severe confusion, inconsistent reporting of symptoms, and a lack of safety due to his inability to contract for safety upon admission.The admission was indicated to stabilize mood, manage psychotic symptoms, ensure safety, and initiate a comprehensive treatment plan, including medication adjustments and therapeutic interventions.  Principal Problem: Bipolar affective disorder, current episode manic with psychotic symptoms [facility]) Discharge Diagnoses: Principal Problem:   Bipolar affective disorder, current episode manic with psychotic symptoms (HCC) Active Problems:   Unresolved grief   Suicide attempt by other psychotropic drug overdose [facility])   Past Psychiatric History: see below  Past Medical History:  Past Medical History:  Diagnosis Date   Asthma    Bipolar 1 disorder (HCC)     Past Surgical History:  Procedure Laterality Date   HERNIA REPAIR     KNEE SURGERY     Family History: History reviewed. No pertinent family history. Family Psychiatric  History: none reported Social History:  Social History   Substance and Sexual Activity  Alcohol Use No     Social History   Substance and Sexual Activity  Drug Use No    Social History   Socioeconomic History   Marital status: Single     Spouse name: Not on file   Number of children: Not on file   Years of education: Not on file   Highest education level: Not on file  Occupational History   Not on file  Tobacco Use   Smoking status: Never   Smokeless tobacco: Never  Vaping Use   Vaping status: Never Used  Substance and Sexual Activity   Alcohol use: No   Drug use: No   Sexual activity: Yes  Other Topics Concern   Not on file  Social History Narrative   Not on file   Social Drivers of Health   Financial Resource Strain: Low Risk  (03/31/2022)   Received from [facility], [facility]   Overall Financial Resource Strain (CARDIA)    Difficulty of Paying Living Expenses: Not hard at all  Food Insecurity: No Food Insecurity (01/07/2023)   Hunger Vital Sign    Worried About Running Out of Food in the Last Year: Never true    Ran Out of Food in the Last Year: Never true  Transportation Needs: No Transportation Needs (01/07/2023)   PRAPARE - Administrator, Civil Service (Medical): No    Lack of Transportation (Non-Medical): No  Physical Activity: Not on file  Stress: Not on file  Social Connections: Not on file    Hospital Course:  The patient was admitted following a suicide attempt via intentional ingestion, along with a history of depressive symptoms, psychotic features, and severe psychosocial stressors. During the hospital stay, the following interventions and observations were made:The patient was assessed for depressive symptoms, anxiety, and psychotic features, including auditory hallucinations described as "mumbling."Collateral information from family and significant  others highlighted discrepancies in the patient's account, emphasizing psychosocial stressors contributing to his condition.Initiated Haldol 3 mg BID for psychotic symptoms, along with Cogentin 1 mg daily to manage extrapyramidal side effects. Buspirone (Buspar) 10 mg BID was prescribed for anxiety  contributing to psychotic symptoms. Initiated Depakote ER 500 mg nightly for mood stabilization, with plans to monitor therapeutic levels within 14 days.Prescribed Trazodone 50 mg nightly as needed for insomnia.Started Bupropion SR 150 mg daily to address low energy and motivation.Transitioned to Abilify 2 mg daily for 15 days, in preparation for Abilify Maintena 300 mg IM injection after concurrent oral therapy.Individual therapy sessions focused on stress management, emotional regulation, and grief counseling.Patient engaged consistently but showed some responsiveness to therapeutic interventions.Psychoeducation provided on medication adherence and recognizing early warning signs of relapse.The patient initially demonstrated severe confusion and inability to contract for safety, necessitating close observation.Improved mood stability and engagement were noted toward discharge, with no further suicidal ideation or immediate safety concerns reported.The patient was provided with a comprehensive discharge plan, including medications, crisis resources, and follow-up appointments.Crisis hotline information, including 1-800-273-TALK (8255) and local psychiatric emergency contacts, was shared.Scheduled outpatient follow-up within 7 days to monitor symptom progression and medication efficacy.The patient was discharged in a stable condition with a detailed treatment plan. He was cooperative with discharge instructions and expressed motivation to continue treatment and transition to long-term management strategies. Regular monitoring and follow-up are essential to ensure continued stability and adherence to the treatment plan.   Musculoskeletal: Strength & Muscle Tone: within normal limits Gait & Station: normal Patient leans: N/A   Psychiatric Specialty Exam:  Presentation  General Appearance:  Appropriate for Environment; Neat  Eye Contact: Good  Speech: Clear and Coherent  Speech  Volume: Normal  Handedness: Right   Mood and Affect  Mood: Euthymic  Affect: Appropriate   Thought Process  Thought Processes: Coherent  Descriptions of Associations:Intact  Orientation:Full (Time, Place and Person) (and situation)  Thought Content:Logical; WDL  History of Schizophrenia/Schizoaffective disorder:No  Duration of Psychotic Symptoms:No data recorded Hallucinations:Hallucinations: None  Ideas of Reference:None  Suicidal Thoughts:Suicidal Thoughts: No  Homicidal Thoughts:Homicidal Thoughts: No   Sensorium  Memory: Immediate Good; Remote Good  Judgment: Fair  Insight: Fair   Art therapist  Concentration: Fair  Attention Span: Fair  Recall: Good  Fund of Knowledge: Good  Language: Good   Psychomotor Activity  Psychomotor Activity: Psychomotor Activity: Normal   Assets  Assets: Communication Skills; Desire for Improvement; Social Support; Housing   Sleep  Sleep: Sleep: Good Number of Hours of Sleep: 7    Physical Exam: Physical Exam Vitals and nursing note reviewed.  Constitutional:      Appearance: Normal appearance.  HENT:     Head: Normocephalic and atraumatic.     Nose: Nose normal.  Pulmonary:     Effort: Pulmonary effort is normal.  Musculoskeletal:        General: Normal range of motion.     Cervical back: Normal range of motion.  Neurological:     General: No focal deficit present.     Mental Status: He is alert and oriented to person, place, and time. Mental status is at baseline.  Psychiatric:        Attention and Perception: Attention and perception normal.        Mood and Affect: Mood and affect normal.        Speech: Speech normal.        Behavior: Behavior normal. Behavior is cooperative.  Thought Content: Thought content normal.        Cognition and Memory: Cognition and memory normal.        Judgment: Judgment is impulsive.    Review of Systems  All other systems reviewed  and are negative.  Blood pressure 131/74, pulse 86, temperature 98.3 F (36.8 C), resp. rate (!) 22, height 5\' 10"  (1.778 m), weight 104.3 kg, SpO2 99%. Body mass index is 32.99 kg/m.   Social History   Tobacco Use  Smoking Status Never  Smokeless Tobacco Never   Tobacco Cessation:  N/A, patient does not currently use tobacco products   Blood Alcohol level:  Lab Results  Component Value Date   ETH <10 01/06/2023    Metabolic Disorder Labs:  No results found for: "HGBA1C", "MPG" No results found for: "PROLACTIN" Lab Results  Component Value Date   CHOL 178 01/09/2023   TRIG 54 01/09/2023   HDL 32 (L) 01/09/2023   CHOLHDL 5.6 01/09/2023   VLDL 11 01/09/2023   LDLCALC 135 (H) 01/09/2023    See Psychiatric Specialty Exam and Suicide Risk Assessment completed by Attending Physician prior to discharge.  Discharge destination:  Home  Is patient on multiple antipsychotic therapies at discharge:  No   Has Patient had three or more failed trials of antipsychotic monotherapy by history:  No  Recommended Plan for Multiple Antipsychotic Therapies: NA   Allergies as of 01/11/2023       Reactions   Promethazine Other (See Comments)   Unknown        Medication List     STOP taking these medications    albuterol 108 (90 Base) MCG/ACT inhaler Commonly known as: VENTOLIN HFA   benzonatate 100 MG capsule Commonly known as: Tessalon Perles   Cepacol Regular Strength 3 MG lozenge Generic drug: menthol-cetylpyridinium   famotidine 20 MG tablet Commonly known as: PEPCID   HYDROcodone-acetaminophen 5-325 MG tablet Commonly known as: Norco   Intuniv 3 MG Tb24 Generic drug: GuanFACINE HCl   lisdexamfetamine 70 MG capsule Commonly known as: VYVANSE   lithium 300 MG tablet   loratadine 10 MG tablet Commonly known as: Claritin   lurasidone 20 MG Tabs tablet Commonly known as: LATUDA   ondansetron 4 MG disintegrating tablet Commonly known as: ZOFRAN-ODT        TAKE these medications      Indication  ARIPiprazole 2 MG tablet Commonly known as: ABILIFY Take 1 tablet (2 mg total) by mouth daily for 15 days. Start taking on: January 12, 2023 What changed:  medication strength how much to take  Indication: Manic Phase of Manic-Depression   ARIPiprazole ER 400 MG Srer injection Commonly known as: ABILIFY MAINTENA Inject 1.5 mLs (300 mg total) into the muscle every 28 (twenty-eight) days for 1 dose. Start taking on: February 07, 2023 What changed: You were already taking a medication with the same name, and this prescription was added. Make sure you understand how and when to take each.  Indication: Manic-Depression, do not dispense to patient   buPROPion 75 MG tablet Commonly known as: WELLBUTRIN Take 1 tablet (75 mg total) by mouth daily. Start taking on: January 12, 2023  Indication: Attention Deficit Hyperactivity Disorder   divalproex 250 MG DR tablet Commonly known as: DEPAKOTE Take 1 tablet (250 mg total) by mouth every 12 (twelve) hours.  Indication: Manic Phase of Manic-Depression   melatonin 5 MG Tabs Take 0.5 tablets (2.5 mg total) by mouth at bedtime.  Indication: Trouble Sleeping  valACYclovir 1000 MG tablet Commonly known as: VALTREX Take 1,000 mg by mouth daily as needed (flare).  Indication: Herpes Simplex Infection        Follow-up Information     Care, [facility]. Go to.   Why: In person appointment made for February 13th at 11:20 AM. Contact information: [address] [place] [address] [phone]                 Follow-up recommendations:  Activity:  as tolerated Diet:  heart healthy  Comments:   Abilify (aripiprazole): 2 mg daily for 15 days. Transition to Abilify Maintena 300 mg IM injection after completing 14 days of concurrent oral medication to ensure therapeutic coverage. Depakote ER (valproic acid): 500 mg nightly. Schedule Depakote level monitoring within  14 days to ensure safe and therapeutic dosing. Check valproic acid levels within 14 days to confirm therapeutic range and adjust dose if necessary to avoid toxicity Instruct patient on the importance of adherence to both oral and injectable Abilify during the transition period. Melatonin: 2.5 mg nightly to aid with sleep regulation. ProvidNational Suicide Prevention Lifeline: 1-800-273-TALK (8255) or 988 (available 24/7, confidential support). Crisis Text Line: Text HOME to 269-368-4710 (free, 24/7 support via text messaging).ed the patient with a crisis hotline: 1-800-273-TALK (8255).    Signed: [name], NP 01/11/2023, 12:01 PM

## 2023-01-11 NOTE — Progress Notes (Signed)
   01/10/23 2000  Psych Admission Type (Psych Patients Only)  Admission Status Involuntary  Psychosocial Assessment  Patient Complaints Worrying  Eye Contact Brief  Facial Expression Flat  Affect Flat  Speech Logical/coherent  Interaction Assertive  Motor Activity Slow  Appearance/Hygiene Layered clothes  Behavior Characteristics Cooperative  Mood Pleasant  Thought Process  Coherency WDL  Content WDL  Delusions None reported or observed  Perception WDL  Hallucination None reported or observed  Judgment WDL  Confusion None  Danger to Self  Current suicidal ideation? Denies  Agreement Not to Harm Self Yes  Description of Agreement verbal  Danger to Others  Danger to Others None reported or observed  Danger to Others Abnormal  Harmful Behavior to others No threats or harm toward other people   No distress noted, he denies SI/HI/AVH, thoughts are organized and coherent no bizarre behavior, mood is congruent with affect. 15 minutes safety checks maintained will continue to monitor.

## 2023-01-11 NOTE — Plan of Care (Signed)
  Problem: Education: Goal: Knowledge of [facility] Education information/materials will improve Outcome: Progressing Goal: Emotional status will improve Outcome: Progressing Goal: Mental status will improve Outcome: Progressing Goal: Verbalization of understanding the information provided will improve Outcome: Progressing   Problem: Activity: Goal: Interest or engagement in activities will improve Outcome: Progressing Goal: Sleeping patterns will improve Outcome: Progressing   Problem: Coping: Goal: Ability to verbalize frustrations and anger appropriately will improve Outcome: Progressing Goal: Ability to demonstrate self-control will improve Outcome: Progressing   Problem: Safety: Goal: Periods of time without injury will increase Outcome: Progressing   Problem: Education: Goal: Knowledge of [facility] Education information/materials will improve Outcome: Progressing Goal: Emotional status will improve Outcome: Progressing Goal: Mental status will improve Outcome: Progressing Goal: Verbalization of understanding the information provided will improve Outcome: Progressing   Problem: Activity: Goal: Interest or engagement in activities will improve Outcome: Progressing Goal: Sleeping patterns will improve Outcome: Progressing   Problem: Safety: Goal: Periods of time without injury will increase Outcome: Progressing   Problem: Education: Goal: Ability to make informed decisions regarding treatment will improve Outcome: Progressing   Problem: Medication: Goal: Compliance with prescribed medication regimen will improve Outcome: Progressing   Problem: Self-Concept: Goal: Ability to disclose and discuss suicidal ideas will improve Outcome: Progressing Goal: Will verbalize positive feelings about self Outcome: Progressing Note: Patient is on track. Patient will maintain adherence

## 2023-01-11 NOTE — BHH Suicide Risk Assessment (Signed)
[facility] Discharge Suicide Risk Assessment   Principal Problem: Bipolar affective disorder, current episode manic with psychotic symptoms (HCC) Discharge Diagnoses: Principal Problem:   Bipolar affective disorder, current episode manic with psychotic symptoms (HCC) Active Problems:   Unresolved grief   Suicide attempt by other psychotropic drug overdose (HCC)   Total Time spent with patient: 1 hour  Musculoskeletal: Strength & Muscle Tone: within normal limits Gait & Station: normal Patient leans: N/A  Psychiatric Specialty Exam  Presentation  General Appearance:  Appropriate for Environment  Eye Contact: Good  Speech: Clear and Coherent; Normal Rate  Speech Volume: Normal  Handedness: Right   Mood and Affect  Mood: Anxious  Duration of Depression Symptoms: Greater than two weeks  Affect: Appropriate; Full Range   Thought Process  Thought Processes: Coherent; Goal Directed  Descriptions of Associations:Intact  Orientation:Full (Time, Place and Person) (and situation)  Thought Content:WDL  History of Schizophrenia/Schizoaffective disorder:No  Duration of Psychotic Symptoms:No data recorded Hallucinations:Hallucinations: None  Ideas of Reference:None  Suicidal Thoughts:Suicidal Thoughts: No  Homicidal Thoughts:Homicidal Thoughts: No   Sensorium  Memory: Immediate Good; Remote Good  Judgment: Fair  Insight: Fair   Art therapist  Concentration: Good  Attention Span: Fair  Recall: Good  Fund of Knowledge: Good  Language: Good   Psychomotor Activity  Psychomotor Activity: Psychomotor Activity: Normal   Assets  Assets: Communication Skills; Housing; Social Support; Physical Health   Sleep  Sleep: Sleep: Good Number of Hours of Sleep: 7   Physical Exam: Physical Exam Vitals and nursing note reviewed.  Constitutional:      Appearance: Normal appearance.  HENT:     Head: Normocephalic and atraumatic.      Nose: Nose normal.  Pulmonary:     Effort: Pulmonary effort is normal.  Musculoskeletal:        General: Normal range of motion.     Cervical back: Normal range of motion.  Neurological:     General: No focal deficit present.     Mental Status: He is alert and oriented to person, place, and time. Mental status is at baseline.  Psychiatric:        Attention and Perception: Attention and perception normal.        Mood and Affect: Mood and affect normal.        Speech: Speech normal.        Behavior: Behavior normal. Behavior is cooperative.        Thought Content: Thought content normal.        Cognition and Memory: Cognition and memory normal.        Judgment: Judgment normal.    Review of Systems  All other systems reviewed and are negative.  Blood pressure 131/74, pulse 86, temperature 98.3 F (36.8 C), resp. rate (!) 22, height 5\' 10"  (1.778 m), weight 104.3 kg, SpO2 99%. Body mass index is 32.99 kg/m.  Mental Status Per Nursing Assessment::   On Admission:  Suicidal ideation indicated by patient  Demographic Factors:  Male, Low socioeconomic status, and Unemployed  Loss Factors: Decrease in vocational status and Financial problems/change in socioeconomic status  Historical Factors: Prior suicide attempts and Impulsivity  Risk Reduction Factors:   Responsible for children under 85 years of age, Living with another person, especially a relative, Positive social support, and Positive coping skills or problem solving skills  Continued Clinical Symptoms:  Bipolar Disorder:   Depressive phase  Cognitive Features That Contribute To Risk:  None    Suicide Risk:  Minimal:  No identifiable suicidal ideation.  Patients presenting with no risk factors but with morbid ruminations; may be classified as minimal risk based on the severity of the depressive symptoms   Follow-up Information     [facility], [place]. Go to.   Why: In person appointment made for February 13th  at 11:20 AM. Contact information: [address] [place] [address] [phone]                 Plan Of Care/Follow-up recommendations:  Activity:  as tolerated Diet:  heart healthy Abilify (aripiprazole): 2 mg daily for 15 days. Transition to Abilify Maintena 300 mg IM injection after completing 14 days of concurrent oral medication to ensure therapeutic coverage. Depakote ER (valproic acid): 500 mg nightly. Schedule Depakote level monitoring within 14 days to ensure safe and therapeutic dosing. Check valproic acid levels within 14 days to confirm therapeutic range and adjust dose if necessary to avoid toxicity Instruct patient on the importance of adherence to both oral and injectable Abilify during the transition period. Melatonin: 2.5 mg nightly to aid with sleep regulation. ProvidNational Suicide Prevention Lifeline: 1-800-273-TALK (8255) or 988 (available 24/7, confidential support). Crisis Text Line: Text HOME to 534-791-4042 (free, 24/7 support via text messaging).ed the patient with a crisis hotline: 1-800-273-TALK (8255).  [name], NP 01/11/2023, 11:13 AM

## 2023-01-11 NOTE — Progress Notes (Signed)
   01/10/23 2000  Psych Admission Type (Psych Patients Only)  Admission Status Involuntary  Psychosocial Assessment  Patient Complaints Worrying  Eye Contact Brief  Facial Expression Flat  Affect Flat  Speech Logical/coherent  Interaction Assertive  Motor Activity Slow  Appearance/Hygiene Layered clothes  Behavior Characteristics Cooperative  Mood Pleasant  Thought Process  Coherency WDL  Content WDL  Delusions None reported or observed  Perception WDL  Hallucination None reported or observed  Judgment WDL  Confusion None  Danger to Self  Current suicidal ideation? Denies  Agreement Not to Harm Self Yes  Description of Agreement verbal  Danger to Others  Danger to Others None reported or observed  Danger to Others Abnormal  Harmful Behavior to others No threats or harm toward other people   Patient alert and oriented X 4 no distress noted, denies SI/HI/AVH, 15 minutes safety checks will continue to monitor.

## 2023-01-11 NOTE — Group Note (Signed)
Recreation Therapy Group Note   Group Topic:Coping Skills  Group Date: 01/11/2023 Start Time: 1000 End Time: 1050 Facilitators: Rosina Lowenstein, LRT, CTRS Location:  Craft Room  Group Description: Mind Map.  Patient was provided a blank template of a diagram with 32 blank boxes in a tiered system, branching from the center (similar to a bubble chart). LRT directed patients to label the middle of the diagram "Coping Skills". LRT and patients then came up with 8 different coping skills as examples. Pt were directed to record their coping skills in the 2nd tier boxes closest to the center.  Patients would then share their coping skills with the group as LRT wrote them out. LRT gave a handout of 99 different coping skills at the end of group.   Goal Area(s) Addressed: Patients will be able to define "coping skills". Patient will identify new coping skills.  Patient will increase communication.   Affect/Mood: Appropriate   Participation Level: Active and Engaged   Participation Quality: Independent   Behavior: Appropriate, Calm, and Cooperative   Speech/Thought Process: Coherent   Insight: Good   Judgement: Good   Modes of Intervention: Clarification, Education, Exploration, Guided Discussion, and Worksheet   Patient Response to Interventions:  Attentive, Engaged, Interested , and Receptive   Education Outcome:  Acknowledges education   Clinical Observations/Individualized Feedback: Zephen was active in their participation of session activities and group discussion. Pt identified "gym, fishing, stress ball, read, and football" as coping skills. Pt spontaneously contributed to group discussion while interacting well with LRT and peers duration of session.    Plan: Continue to engage patient in RT group sessions 2-3x/week.   Rosina Lowenstein, LRT, CTRS 01/11/2023 1:04 PM

## 2023-01-11 NOTE — Progress Notes (Signed)
  [facility] Adult Case Management Discharge Plan :  Will you be returning to the same living situation after discharge:  Yes,  Patient to return home.  At discharge, do you have transportation home?: Yes,  Patient to be transported by family.  Do you have the ability to pay for your medications: Yes, [employer]ARE / Suan Halter DUAL COMPLETE   Release of information consent forms completed and in the chart;  Patient's signature needed at discharge.  Patient to Follow up at:  Follow-up Information     Care, [place]. Go to.   Why: In person appointment made for February 13th at 11:20 AM. Contact information: [address] [place] [address] [phone]                 Next level of care provider has access to [facility] Link:no  Safety Planning and Suicide Prevention discussed: Yes,  SPE conducted with patient's mother with patient's consent. SPE material given at discharge.      Has patient been referred to the Quitline?: Patient does not use tobacco/nicotine products  Patient has been referred for addiction treatment: Patient refused referral for treatment; referral information given to patient at discharge.  Lowry Ram, LCSW 01/11/2023, 9:42 AM

## 2023-01-11 NOTE — Progress Notes (Signed)
Discharge Note:  Patient denies SI/HI/AVH at this time but, noted talking to self @ intervals. Thought process seems clear and intact, "just waiting to leave today". Discharge instructions, AVS, prescriptions, and transition record reviewed with patient. Patient agrees to comply with medication management, follow-up visit, and outpatient therapy. Patient belongings returned to patient. Patient questions and concerns addressed and answered. Patient ambulatory off unit. Patient discharged to home with mother @1300  and without incident.

## 2023-01-11 NOTE — BHH Counselor (Signed)
CSW spoke with  Larry Ball/mother/guardian [phone]) to notify of patient's discharge.   Guardian in agreement and explained that she will await further communication from the patient to come and pick him up. CSW spoke with guardian of patient's upcoming appointment as well as explained resources provided in discharge paperwork.   There are no further needs.   Reymundo Poll, MSW, LCSWA 01/11/2023 9:56 AM

## 2023-01-26 ENCOUNTER — Other Ambulatory Visit: Payer: Self-pay

## 2023-01-26 ENCOUNTER — Emergency Department: Payer: 59

## 2023-01-26 ENCOUNTER — Emergency Department
Admission: EM | Admit: 2023-01-26 | Discharge: 2023-01-26 | Discharge status: Against Medical Advice | Payer: 59 | Attending: Emergency Medicine | Admitting: Emergency Medicine

## 2023-01-26 DIAGNOSIS — R0602 Shortness of breath: Secondary | ICD-10-CM | POA: Diagnosis not present

## 2023-01-26 DIAGNOSIS — R079 Chest pain, unspecified: Secondary | ICD-10-CM | POA: Diagnosis present

## 2023-01-26 DIAGNOSIS — Z5321 Procedure and treatment not carried out due to patient leaving prior to being seen by health care provider: Secondary | ICD-10-CM | POA: Diagnosis not present

## 2023-01-26 LAB — BASIC METABOLIC PANEL
Anion gap: 15 (ref 5–15)
BUN: 12 mg/dL (ref 6–20)
CO2: 20 mmol/L — ABNORMAL LOW (ref 22–32)
Calcium: 8.9 mg/dL (ref 8.9–10.3)
Chloride: 102 mmol/L (ref 98–111)
Creatinine, Ser: 1.47 mg/dL — ABNORMAL HIGH (ref 0.61–1.24)
GFR, Estimated: >60 mL/min (ref >60)
Glucose, Bld: 95 mg/dL (ref 70–99)
Potassium: 3.5 mmol/L (ref 3.5–5.1)
Sodium: 137 mmol/L (ref 135–145)

## 2023-01-26 LAB — CBC
HCT: 44.6 % (ref 39.0–52.0)
Hemoglobin: 14.7 g/dL (ref 13.0–17.0)
MCH: 29.9 pg (ref 26.0–34.0)
MCHC: 33 g/dL (ref 30.0–36.0)
MCV: 90.7 fL (ref 80.0–100.0)
Platelets: 373 10*3/uL (ref 150–400)
RBC: 4.92 MIL/uL (ref 4.22–5.81)
RDW: 13.6 % (ref 11.5–15.5)
WBC: 11 10*3/uL — ABNORMAL HIGH (ref 4.0–10.5)
nRBC: 0 % (ref 0.0–0.2)

## 2023-01-26 LAB — TROPONIN I (HIGH SENSITIVITY): Troponin I (High Sensitivity): 6 ng/L (ref <18)

## 2023-01-26 NOTE — ED Notes (Signed)
 No answer when called several times from lobby

## 2023-01-26 NOTE — ED Triage Notes (Signed)
 Pt states he began to have sharp chest pain aprox 20 min pt also reports some sob. Pt denies cough congestion or fever. Pt denies known cardiac hx.

## 2023-02-10 ENCOUNTER — Other Ambulatory Visit: Payer: Self-pay

## 2023-02-10 ENCOUNTER — Emergency Department
Admission: EM | Admit: 2023-02-10 | Discharge: 2023-02-10 | Disposition: A | Discharge status: Home / Self Care | Payer: 59 | Attending: Emergency Medicine | Admitting: Emergency Medicine

## 2023-02-10 ENCOUNTER — Emergency Department: Payer: 59

## 2023-02-10 DIAGNOSIS — M25561 Pain in right knee: Secondary | ICD-10-CM | POA: Diagnosis present

## 2023-02-10 DIAGNOSIS — M25461 Effusion, right knee: Secondary | ICD-10-CM | POA: Diagnosis not present

## 2023-02-10 MED ORDER — ACETAMINOPHEN 500 MG PO TABS
1000.0000 mg | ORAL_TABLET | Freq: Once | ORAL | Status: AC
  Administered 2023-02-10: 1000 mg via ORAL
  Filled 2023-02-10: qty 2

## 2023-02-10 NOTE — Discharge Instructions (Signed)
You are seen in the emergency department for right knee pain.  You had an x-ray done that did not show any findings of a broken bone.  Concern that you could have a ligament injury or meniscus tear.  He could alternate Motrin and Tylenol for pain control.  You are given a knee brace.  You can bear weight as tolerated.  You are given crutches.  Follow-up closely with orthopedics.  Return to the emergency department for any worsening symptoms.  Pain control:  Ibuprofen (motrin/aleve/advil) - You can take 3 tablets (600 mg) every 6 hours as needed for pain/fever.  Acetaminophen (tylenol) - You can take 2 extra strength tablets (1000 mg) every 6 hours as needed for pain/fever.  You can alternate these medications or take them together.  Make sure you eat food/drink water when taking these medications.  Thank you for choosing [place] for your health care, it was my pleasure to care for you today!  Corena Herter, MD

## 2023-02-10 NOTE — ED Provider Triage Note (Signed)
Emergency Medicine Provider Triage Evaluation Note  Larry Ball , a 28 y.o. male  was evaluated in triage.  Pt complains of right knee pain, was at the gym working out and felt a pop and has large amount of swelling and pain and cannot extend the leg fully.  Review of Systems  Positive:  Negative:   Physical Exam  Pulse 95   Temp 98.6 F (37 C) (Oral)   Resp 20   SpO2 100%  Gen:   Awake, no distress   Resp:  Normal effort  MSK:   Right knee swollen tender to palpation Other:    Medical Decision Making  Medically screening exam initiated at 10:44 AM.  Appropriate orders placed.  Larry Ball was informed that the remainder of the evaluation will be completed by another provider, this initial triage assessment does not replace that evaluation, and the importance of remaining in the ED until their evaluation is complete.  X-ray of the right knee   Faythe Ghee, PA-C 02/10/23 1045

## 2023-02-10 NOTE — ED Triage Notes (Signed)
Pt here with right knee pain. Pt states he was working out and felt his knee pop. Pt states when he puts pressure on it, it hurts. Pt feels like his knee is loose.
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
## 2023-02-10 NOTE — ED Notes (Signed)
Spoke with legal guardian (mother). Mother informed of pt's discharge.

## 2023-02-10 NOTE — ED Notes (Signed)
Pt verbalizes understanding of discharge instructions. Opportunity for questioning and answers were provided. Pt discharged from ED to home with family.

## 2023-02-24 ENCOUNTER — Ambulatory Visit (HOSPITAL_BASED_OUTPATIENT_CLINIC_OR_DEPARTMENT_OTHER): Payer: 59 | Admitting: Orthopaedic Surgery

## 2023-02-24 DIAGNOSIS — M2391 Unspecified internal derangement of right knee: Secondary | ICD-10-CM

## 2023-02-24 NOTE — Addendum Note (Signed)
Addended by: Jeanella Cara on: 02/24/2023 02:25 PM   Modules accepted: Orders
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
## 2023-03-02 ENCOUNTER — Ambulatory Visit
Admission: RE | Admit: 2023-03-02 | Discharge: 2023-03-02 | Disposition: A | Discharge status: Home / Self Care | Payer: 59 | Source: Ambulatory Visit | Attending: Orthopaedic Surgery | Admitting: Orthopaedic Surgery

## 2023-03-02 DIAGNOSIS — M2391 Unspecified internal derangement of right knee: Secondary | ICD-10-CM

## 2023-03-24 ENCOUNTER — Ambulatory Visit (HOSPITAL_BASED_OUTPATIENT_CLINIC_OR_DEPARTMENT_OTHER): Payer: Self-pay | Admitting: Orthopaedic Surgery

## 2023-03-24 ENCOUNTER — Ambulatory Visit (HOSPITAL_BASED_OUTPATIENT_CLINIC_OR_DEPARTMENT_OTHER): Payer: 59 | Admitting: Orthopaedic Surgery

## 2023-03-24 ENCOUNTER — Other Ambulatory Visit (HOSPITAL_BASED_OUTPATIENT_CLINIC_OR_DEPARTMENT_OTHER): Payer: Self-pay

## 2023-03-24 DIAGNOSIS — M2391 Unspecified internal derangement of right knee: Secondary | ICD-10-CM | POA: Diagnosis not present

## 2023-03-24 MED ORDER — ACETAMINOPHEN 500 MG PO TABS
500.0000 mg | ORAL_TABLET | Freq: Three times a day (TID) | ORAL | 0 refills | Status: AC
Start: 2023-03-24 — End: 2023-04-03
  Filled 2023-03-24: qty 30, 10d supply, fill #0

## 2023-03-24 MED ORDER — IBUPROFEN 800 MG PO TABS
800.0000 mg | ORAL_TABLET | Freq: Three times a day (TID) | ORAL | 0 refills | Status: AC
  Filled 2023-03-24: qty 30, 10d supply, fill #0

## 2023-03-24 MED ORDER — OXYCODONE HCL 5 MG PO TABS
5.0000 mg | ORAL_TABLET | ORAL | 0 refills | Status: DC | PRN
  Filled 2023-03-24: qty 10, 2d supply, fill #0
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
## 2023-04-03 ENCOUNTER — Other Ambulatory Visit (HOSPITAL_BASED_OUTPATIENT_CLINIC_OR_DEPARTMENT_OTHER): Payer: Self-pay

## 2023-05-05 ENCOUNTER — Encounter (HOSPITAL_BASED_OUTPATIENT_CLINIC_OR_DEPARTMENT_OTHER): Admitting: Orthopaedic Surgery

## 2023-07-12 ENCOUNTER — Emergency Department

## 2023-07-12 ENCOUNTER — Encounter: Payer: Self-pay | Admitting: *Deleted

## 2023-07-12 ENCOUNTER — Other Ambulatory Visit: Payer: Self-pay

## 2023-07-12 ENCOUNTER — Emergency Department
Admission: EM | Admit: 2023-07-12 | Discharge: 2023-07-13 | Disposition: A | Discharge status: Home / Self Care | Attending: Emergency Medicine | Admitting: Emergency Medicine

## 2023-07-12 DIAGNOSIS — R55 Syncope and collapse: Secondary | ICD-10-CM | POA: Insufficient documentation

## 2023-07-12 DIAGNOSIS — J45909 Unspecified asthma, uncomplicated: Secondary | ICD-10-CM | POA: Diagnosis not present

## 2023-07-12 DIAGNOSIS — E860 Dehydration: Secondary | ICD-10-CM | POA: Insufficient documentation

## 2023-07-12 LAB — BASIC METABOLIC PANEL WITH GFR
Anion gap: 8 (ref 5–15)
BUN: 11 mg/dL (ref 6–20)
CO2: 24 mmol/L (ref 22–32)
Calcium: 8.6 mg/dL — ABNORMAL LOW (ref 8.9–10.3)
Chloride: 107 mmol/L (ref 98–111)
Creatinine, Ser: 1.49 mg/dL — ABNORMAL HIGH (ref 0.61–1.24)
GFR, Estimated: >60 mL/min (ref >60)
Glucose, Bld: 91 mg/dL (ref 70–99)
Potassium: 3.5 mmol/L (ref 3.5–5.1)
Sodium: 139 mmol/L (ref 135–145)

## 2023-07-12 LAB — CBC
HCT: 41.6 % (ref 39.0–52.0)
Hemoglobin: 13.9 g/dL (ref 13.0–17.0)
MCH: 30.2 pg (ref 26.0–34.0)
MCHC: 33.4 g/dL (ref 30.0–36.0)
MCV: 90.2 fL (ref 80.0–100.0)
Platelets: 352 10*3/uL (ref 150–400)
RBC: 4.61 MIL/uL (ref 4.22–5.81)
RDW: 13.3 % (ref 11.5–15.5)
WBC: 8.6 10*3/uL (ref 4.0–10.5)
nRBC: 0 % (ref 0.0–0.2)

## 2023-07-12 LAB — CK: Total CK: 425 U/L — ABNORMAL HIGH (ref 49–397)

## 2023-07-12 LAB — TROPONIN I (HIGH SENSITIVITY)
Troponin I (High Sensitivity): 8 ng/L (ref <18)
Troponin I (High Sensitivity): 8 ng/L (ref <18)

## 2023-07-12 MED ORDER — SODIUM CHLORIDE 0.9 % IV BOLUS
1000.0000 mL | Freq: Once | INTRAVENOUS | Status: DC
Start: 2023-07-12 — End: 2023-07-13

## 2023-07-12 NOTE — ED Notes (Signed)
 To ED via [facility] from home c/o near syncope.

## 2023-07-12 NOTE — ED Triage Notes (Signed)
 Pt brought in via [facility] from home with near syncope.  Pt states he was working out became dizzy and felt like he was going to pass out.  No chest pain or sob.  Pt states he has a headache.  Pt alert  speech clear.

## 2023-07-12 NOTE — ED Provider Notes (Signed)
 [facility] Provider Note    Event Date/Time   First MD Initiated Contact with Patient 07/12/23 2310     (approximate)   History   Near Syncope   HPI  Larry Ball is a 28 y.o. male brought to the ED via EMS from home with a chief complaint of near syncope while working out.  Patient states this has happened to him previously when he gets hot.  He was working out, became hot and dizzy like he was going to pass out.  Denies syncope.  Denies preceding symptoms of chest pain, shortness of breath, abdominal pain.  Denies associated nausea, vomiting or diarrhea.  Initially had mild headache which has since resolved.  Denies substance use or alcohol.     Past Medical History   Past Medical History:  Diagnosis Date   Asthma    Bipolar 1 disorder [facility])      Active Problem List   Patient Active Problem List   Diagnosis Date Noted   Bipolar affective disorder, current episode manic with psychotic symptoms (HCC) 01/10/2023   Unresolved grief 01/08/2023   Suicide attempt by other psychotropic drug overdose (HCC) 01/08/2023   MDD (major depressive disorder), recurrent episode, severe (HCC) 01/07/2023     Past Surgical History   Past Surgical History:  Procedure Laterality Date   HERNIA REPAIR     KNEE SURGERY       Home Medications   Prior to Admission medications   Medication Sig Start Date End Date Taking? Authorizing Provider  ARIPiprazole  (ABILIFY ) 2 MG tablet Take 1 tablet (2 mg total) by mouth daily for 15 days. 01/12/23 01/27/23  [name], NP  ARIPiprazole  ER (ABILIFY  MAINTENA) 400 MG SRER injection Inject 1.5 mLs (300 mg total) into the muscle every 28 (twenty-eight) days for 1 dose. 02/07/23 02/08/23  [name], NP  buPROPion  (WELLBUTRIN ) 75 MG tablet Take 1 tablet (75 mg total) by mouth daily. 01/12/23 02/11/23  [name], NP  divalproex  (DEPAKOTE ) 250 MG DR tablet Take 1 tablet (250 mg total) by mouth every 12 (twelve)  hours. 01/11/23 02/10/23  [name], NP  oxyCODONE  (ROXICODONE ) 5 MG immediate release tablet Take 1 tablet (5 mg total) by mouth every 4 (four) hours as needed for severe pain (pain score 7-10) or breakthrough pain. 03/24/23   [name], MD  valACYclovir  (VALTREX ) 1000 MG tablet Take 1,000 mg by mouth daily as needed (flare). 12/18/22 12/18/23  [provider]  cetirizine  (ZYRTEC ) 10 MG tablet Take 1 tablet (10 mg total) by mouth daily. 04/21/15 07/09/15  Cuthriell, Jonathan D, PA-C  fluticasone  (FLONASE ) 50 MCG/ACT nasal spray Place 1 spray into both nostrils 2 (two) times daily. 04/21/15 07/09/15  Cuthriell, Dorn BIRCH, PA-C     Allergies  Promethazine   Family History  History reviewed. No pertinent family history.   Physical Exam  Triage Vital Signs: ED Triage Vitals  Encounter Vitals Group     BP 07/12/23 2106 (!) 163/82     Girls Systolic BP Percentile --      Girls Diastolic BP Percentile --      Boys Systolic BP Percentile --      Boys Diastolic BP Percentile --      Pulse Rate 07/12/23 2106 77     Resp 07/12/23 2106 18     Temp 07/12/23 2106 98.7 F (37.1 C)     Temp Source 07/12/23 2106 Oral     SpO2 07/12/23  2059 97 %     Weight 07/12/23 2104 224 lb 13.9 oz (102 kg)     Height 07/12/23 2104 5' 11 (1.803 m)     Head Circumference --      Peak Flow --      Pain Score 07/12/23 2104 0     Pain Loc --      Pain Education --      Exclude from Growth Chart --     Updated Vital Signs: BP 134/78   Pulse 63   Temp 98.7 F (37.1 C) (Oral)   Resp 16   Ht 5' 11 (1.803 m)   Wt 102 kg   SpO2 96%   BMI 31.36 kg/m    General: Awake, no distress.  CV:  RRR.  Good peripheral perfusion.  Resp:  Normal effort.  CTAB. Abd:  Nontender.  No distention.  Other:  Alert and oriented x 3.  CN II-XII grossly intact.  5/5 motor strength and sensation all extremities.  MAE x 4.   ED Results / Procedures / Treatments  Labs (all labs ordered are listed, but only  abnormal results are displayed) Labs Reviewed  BASIC METABOLIC PANEL WITH GFR - Abnormal; Notable for the following components:      Result Value   Creatinine, Ser 1.49 (*)    Calcium 8.6 (*)    All other components within normal limits  CK - Abnormal; Notable for the following components:   Total CK 425 (*)    All other components within normal limits  CBC  TROPONIN I (HIGH SENSITIVITY)  TROPONIN I (HIGH SENSITIVITY)     EKG  ED ECG REPORT I, Tad Fancher J, the attending physician, personally viewed and interpreted this ECG.   Date: 07/12/2023  EKG Time: 2108  Rate: 64  Rhythm: normal sinus rhythm  Axis: Normal  Intervals:none  ST&T Change: Nonspecific    RADIOLOGY I have independently visualized interpreted patient's imaging study as well as noted the radiology interpretation:  Chest x-ray: No acute cardiopulmonary process  Official radiology report(s): DG Chest Port 1 View Result Date: 07/12/2023 CLINICAL DATA:  Syncope EXAM: PORTABLE CHEST 1 VIEW COMPARISON:  01/26/2023 FINDINGS: The heart size and mediastinal contours are within normal limits. Both lungs are clear. The visualized skeletal structures are unremarkable. IMPRESSION: No active disease. Electronically Signed   By: Norman Gatlin M.D.   On: 07/12/2023 21:32     PROCEDURES:  Critical Care performed: No  Procedures   MEDICATIONS ORDERED IN ED: Medications  sodium chloride  0.9 % bolus 1,000 mL (has no administration in time range)     IMPRESSION / MDM / ASSESSMENT AND PLAN / ED COURSE  I reviewed the triage vital signs and the nursing notes.                             28 year old male presenting with near syncope while working out became hot and dizzy.  Differential diagnosis includes but is not limited to CVA, TIA, ACS, metabolic, infectious etiologies, etc.  I personally reviewed patient's records and note cardiology office visit for syncope on 06/25/2023 as well as family medicine office visit  for syncope from 07/05/2023.  Looks like he has also been referred to neurology and is currently undergoing heart monitoring.  Patient's presentation is most consistent with acute complicated illness / injury requiring diagnostic workup.  The patient is on the cardiac monitor to evaluate for evidence of arrhythmia and/or  significant heart rate changes.  Laboratory results demonstrate stable creatinine from 5 months ago.  Have added CK which is mildly elevated at 425.  Chest x-ray and EKG unremarkable.  Patient became tearful when I suggested IV fluids and repeat troponin; he does not wish to have another needlestick.  Since he is not vomiting, I think it would be reasonable for patient to hydrate orally.  Recommended he stay indoors away from the heat and take a break from working out for a few days.  He is followed by both his PCP and cardiology for recurrent syncope with plans for Holter monitoring and will follow-up closely.  Strict return precautions given.  Patient verbalizes understanding and agrees with plan of care.    FINAL CLINICAL IMPRESSION(S) / ED DIAGNOSES   Final diagnoses:  Near syncope  Dehydration     Rx / DC Orders   ED Discharge Orders     None        Note:  This document was prepared using Dragon voice recognition software and may include unintentional dictation errors.   Jamerica Snavely J, MD 07/13/23 [phone]

## 2023-07-12 NOTE — Discharge Instructions (Signed)
 Drink plenty of fluids daily and stay indoors in the air conditioning.  Return to the ER for recurrent or worsening symptoms, persistent vomiting, difficulty breathing or other concerns.

## 2023-07-12 NOTE — ED Notes (Signed)
 CCMD called to initiate cardiac monitoring

## 2023-07-12 NOTE — ED Provider Notes (Incomplete)
 [facility] Provider Note    Event Date/Time   First MD Initiated Contact with Patient 07/12/23 2310     (approximate)   History   Near Syncope   HPI  Larry Ball is a 28 y.o. male brought to the ED via EMS from home with a chief complaint of near syncope while working out.  Patient states this has happened to him previously when he gets hot.  He was working out, became hot and dizzy like he was going to pass out.  Denies syncope.  Denies preceding symptoms of chest pain, shortness of breath, abdominal pain.  Denies associated nausea, vomiting or diarrhea.  Initially had mild headache which has since resolved.  Denies substance use or alcohol.     Past Medical History   Past Medical History:  Diagnosis Date  . Asthma   . Bipolar 1 disorder [name])      Active Problem List   Patient Active Problem List   Diagnosis Date Noted  . Bipolar affective disorder, current episode manic with psychotic symptoms (HCC) 01/10/2023  . Unresolved grief 01/08/2023  . Suicide attempt by other psychotropic drug overdose (HCC) 01/08/2023  . MDD (major depressive disorder), recurrent episode, severe (HCC) 01/07/2023     Past Surgical History   Past Surgical History:  Procedure Laterality Date  . HERNIA REPAIR    . KNEE SURGERY       Home Medications   Prior to Admission medications   Medication Sig Start Date End Date Taking? Authorizing Provider  ARIPiprazole  (ABILIFY ) 2 MG tablet Take 1 tablet (2 mg total) by mouth daily for 15 days. 01/12/23 01/27/23  [name], NP  ARIPiprazole  ER (ABILIFY  MAINTENA) 400 MG SRER injection Inject 1.5 mLs (300 mg total) into the muscle every 28 (twenty-eight) days for 1 dose. 02/07/23 02/08/23  [name], NP  buPROPion  (WELLBUTRIN ) 75 MG tablet Take 1 tablet (75 mg total) by mouth daily. 01/12/23 02/11/23  [name], NP  divalproex  (DEPAKOTE ) 250 MG DR tablet Take 1 tablet (250 mg total) by mouth every 12  (twelve) hours. 01/11/23 02/10/23  [name], NP  oxyCODONE  (ROXICODONE ) 5 MG immediate release tablet Take 1 tablet (5 mg total) by mouth every 4 (four) hours as needed for severe pain (pain score 7-10) or breakthrough pain. 03/24/23   [name], MD  valACYclovir  (VALTREX ) 1000 MG tablet Take 1,000 mg by mouth daily as needed (flare). 12/18/22 12/18/23  [provider]  cetirizine  (ZYRTEC ) 10 MG tablet Take 1 tablet (10 mg total) by mouth daily. 04/21/15 07/09/15  Cuthriell, Jonathan D, PA-C  fluticasone  (FLONASE ) 50 MCG/ACT nasal spray Place 1 spray into both nostrils 2 (two) times daily. 04/21/15 07/09/15  Cuthriell, Dorn BIRCH, PA-C     Allergies  Promethazine   Family History  History reviewed. No pertinent family history.   Physical Exam  Triage Vital Signs: ED Triage Vitals  Encounter Vitals Group     BP 07/12/23 2106 (!) 163/82     Girls Systolic BP Percentile --      Girls Diastolic BP Percentile --      Boys Systolic BP Percentile --      Boys Diastolic BP Percentile --      Pulse Rate 07/12/23 2106 77     Resp 07/12/23 2106 18     Temp 07/12/23 2106 98.7 F (37.1 C)     Temp Source 07/12/23 2106 Oral     SpO2 07/12/23  2059 97 %     Weight 07/12/23 2104 224 lb 13.9 oz (102 kg)     Height 07/12/23 2104 5' 11 (1.803 m)     Head Circumference --      Peak Flow --      Pain Score 07/12/23 2104 0     Pain Loc --      Pain Education --      Exclude from Growth Chart --     Updated Vital Signs: BP 134/78   Pulse 63   Temp 98.7 F (37.1 C) (Oral)   Resp 16   Ht 5' 11 (1.803 m)   Wt 102 kg   SpO2 96%   BMI 31.36 kg/m    General: Awake, no distress.  CV:  RRR.  Good peripheral perfusion.  Resp:  Normal effort.  CTAB. Abd:  Nontender.  No distention.  Other:  Alert and oriented x 3.  CN II-XII grossly intact.  5/5 motor strength and sensation all extremities.  MAE x 4.   ED Results / Procedures / Treatments  Labs (all labs ordered are listed,  but only abnormal results are displayed) Labs Reviewed  BASIC METABOLIC PANEL WITH GFR - Abnormal; Notable for the following components:      Result Value   Creatinine, Ser 1.49 (*)    Calcium 8.6 (*)    All other components within normal limits  CK - Abnormal; Notable for the following components:   Total CK 425 (*)    All other components within normal limits  CBC  TROPONIN I (HIGH SENSITIVITY)  TROPONIN I (HIGH SENSITIVITY)     EKG  ED ECG REPORT I, Osiris Odriscoll J, the attending physician, personally viewed and interpreted this ECG.   Date: 07/12/2023  EKG Time: 2108  Rate: 64  Rhythm: normal sinus rhythm  Axis: Normal  Intervals:none  ST&T Change: Nonspecific    RADIOLOGY I have independently visualized interpreted patient's imaging study as well as noted the radiology interpretation:  Chest x-ray: No acute cardiopulmonary process  Official radiology report(s): DG Chest Port 1 View Result Date: 07/12/2023 CLINICAL DATA:  Syncope EXAM: PORTABLE CHEST 1 VIEW COMPARISON:  01/26/2023 FINDINGS: The heart size and mediastinal contours are within normal limits. Both lungs are clear. The visualized skeletal structures are unremarkable. IMPRESSION: No active disease. Electronically Signed   By: Norman Gatlin M.D.   On: 07/12/2023 21:32     PROCEDURES:  Critical Care performed: No  Procedures   MEDICATIONS ORDERED IN ED: Medications  sodium chloride  0.9 % bolus 1,000 mL (has no administration in time range)     IMPRESSION / MDM / ASSESSMENT AND PLAN / ED COURSE  I reviewed the triage vital signs and the nursing notes.                             28 year old male presenting with near syncope while working out became hot and dizzy.  Differential diagnosis includes but is not limited to CVA, TIA, ACS, metabolic, infectious etiologies, etc.  I personally reviewed patient's records and note cardiology office visit for syncope on 06/25/2023 as well as family medicine  office visit for syncope from 07/05/2023.  Looks like he has also been referred to neurology and is currently undergoing heart monitoring.  Patient's presentation is most consistent with acute complicated illness / injury requiring diagnostic workup.  The patient is on the cardiac monitor to evaluate for evidence of arrhythmia and/or  significant heart rate changes.  Laboratory      FINAL CLINICAL IMPRESSION(S) / ED DIAGNOSES   Final diagnoses:  Near syncope  Dehydration     Rx / DC Orders   ED Discharge Orders     None        Note:  This document was prepared using Dragon voice recognition software and may include unintentional dictation errors.

## 2023-07-13 NOTE — ED Notes (Signed)
 This rn spoke with mother(renisha Mancuso) who is legal guardian about discharge.

## 2023-07-13 NOTE — ED Notes (Signed)
 Pt refused iv.  Md aware.

## 2023-07-22 NOTE — Discharge Summary (Signed)
 " Larry Ball (Neurology Team B) Physician Discharge Summary     Admit date: 07/20/2023 Discharge date: 07/22/23 Discharge to: Home Discharge Service: [facility] - Neurology Inpatient  B (Larry Ball)  Discharge Physician: Cathryne Levander Schneider, MD Code Status: Full Code  Discharge Diagnoses:  Principal Problem:   Seizure-like activity    (POA: Yes) Active Problems:   Bipolar affective disorder, current episode manic with psychotic symptoms    (POA: Yes) Resolved Problems:   * No resolved hospital problems. *  Follow-up Issues:  Follow-up appointment with Neurology requested Follow-up with PCP within 1-2 weeks of discharge for post-hospital check in. Patient's primary care provider: Lyle Waddell HERO, MD Follow-up with [facility] for Aristada injection for bipolar disorder. Can walk in 7/10 or 7/11. Follow-up with [facility]. Appointment already set for 08/27/2023 at 11:30AM  Pending Test Results: None  Hospital Course:  Larry Ball is a 28 y.o. male with presented with increased frequency and duration of generalized shaking, tongue biting, and eye rolling episodes concerning for possible seizures or PNES events.   ACTIVE PROBLEMS: # Temporal Lobe Epilepsy: Presents with spells often triggered by heat, increasing in frequency over the past 7 months. Previous workup including MRI brain, TTE, ambulatory cardiac monitoring have been negative to date. He saw neurology in 2012 for similar episodes and his initial EEG showed abnormal, focal T3 intermittent theta though repeat 24H follow-up EEG was normal. Initial differential included seizures, PNES, or vasovagal syncope. Our initial concern was primarily for PNES based on descriptions of episodes as patient's parents described multiple different types of episodes, including bilateral leg shaking with preserved consciousness, having a glassy stare for hours, and preserved memory of all events, including those  with somewhat reduced level of consciousness, although the patient denied ever losing consciousness.  Given multiple recent ED admissions for these spells and reported daily frequency of events, the patient was admitted for continuous EEG monitoring and spell capture.  He underwent cvEEG x 48 hours which three brief electrographic seizures from the left temporal lobe as well as frequent slowing over the L temporal lobe and occasional sharp transients from the same region. The seizures were brief and associated with behavioral arrest, but neither he or his parents recognized the event as a seizures.  In addition, over the course of hospitalization, he did not have any of his typical events.  As such, he may still benefit from a prolonged EMU admission to characterize the nature of these spells.  He was given a load of IV lacosamide and discharged on lacosamide 100 mg twice daily with outpatient follow-up and plan for EMU admission.   Plan:  -Start Vimpat 100mg  BID PO -Follow up with [facility]  -Consider EMU admission if patient continues to have episodes despite being on ASM   #Bipolar Disorder  Anxiety Was previously getting Aristada injection for bipolar disorder. Was due for shot on 7/3 but did not receive it. Aristada unavailable in the hospital. Patient also experiencing increased nervousness at home and in the hospital with consistence and noticeable leg twitching. Patient due for Aristada injection outpatient. Also recommend follow up with psychiatry outpatient for increased anxiety and possible medical management per psychiatry input.    -Due for Aristada injection at [facility].  -Recommend follow up with outpatient provider at [facility] 08/27/2023 at 11:30AM     Procedures: cvEEG  Consults:  None   Discharge Exam:  Condition at Discharge: fair Temp:  [36.5  C (97.7 F)-36.9 C (98.4 F)] 36.9 C (98.4 F) Pulse:  [60-147] 79 Resp:   [16-19] 19 BP: (128-170)/(55-96) 146/74 SpO2:  [96 %-99 %] 98 %  General Exam: General Appearance: Well appearing. In no acute distress. HEENT: Head is atraumatic and normocephalic. Sclera anicteric without injection. Oropharyngeal membranes are moist with no erythema or exudate. Neck: Supple. Lungs: Normal work of breathing. Clear to auscultation in anterior fields. No wheezes or crackles. Heart: Regular rate and rhythm. No murmurs, rubs, or gallops. Abdomen: Soft, nontender, nondistended. Extremities: No clubbing, cyanosis, or edema.  Neurological Exam: Mental Status: alert, oriented to person, place and time. GCS 15. Attention and concentration are intact. The patient follows all commands with no difficulty. Speech Fluent without paraphasic errors and no  dysarthria . Names and repeats with no  difficulty. Recent and remote memory appear intact.   Cranial Nerves: Visual fields intact to direct confrontation.  PERRL. Ocular movements intact in all directions. No nystagmus. Facial sensation intact bilaterally to light touch in all three divisions of CNV. Face symmetric at rest. Normal facial movement bilaterally, including forehead, eye closure and grimace/smile. Hearing intact to conversation. Palate movement is symmetric. Tongue protrudes midline and tongue movements are normal.  Motor Exam: Normal bulk. No tremors, myoclonus, or other adventitious movement. Pronator drift is absent. 5/5 throughout all extremities bilaterally Some restless tremor in bilateral legs  Reflexes:  DTRs are 2+ and symmetric throughout.  Sensory exam:  Sensation normal to light touch in both hands and both feet.  Cerebellar/Coordination: Rapid alternating movements are normal bilaterally. Finger-to-nose and heel-to-shin testing bilaterally demonstrates no abnormalities .  Gait: Deferred  Pertinent Test Results:  Relevant Labs: All Labs Last 24hrs:  Recent Results (from the past 24 hours)  Basic  metabolic panel   Collection Time: 07/22/23  4:55 AM  Result Value Ref Range   Sodium 143 135 - 145 mmol/L   Potassium 3.8 3.4 - 4.8 mmol/L   Chloride 106 98 - 107 mmol/L   CO2 28.0 20.0 - 31.0 mmol/L   Anion Gap 9 5 - 14 mmol/L   BUN 11 9 - 23 mg/dL   Creatinine 8.59 (H) 9.26 - 1.18 mg/dL   BUN/Creatinine Ratio 8    eGFR CKD-EPI (2021) Male 70 >=60 mL/min/1.54m2   Glucose 90 70 - 179 mg/dL   Calcium 8.8 8.7 - 89.5 mg/dL  CBC   Collection Time: 07/22/23  4:55 AM  Result Value Ref Range   WBC 6.4 3.6 - 11.2 10*9/L   RBC 4.61 4.26 - 5.60 10*12/L   HGB 14.0 12.9 - 16.5 g/dL   HCT 59.3 60.9 - 51.9 %   MCV 88.2 77.6 - 95.7 fL   MCH 30.5 25.9 - 32.4 pg   MCHC 34.5 32.0 - 36.0 g/dL   RDW 86.2 87.7 - 84.7 %   MPV 8.6 6.8 - 10.7 fL   Platelet 315 150 - 450 10*9/L    Relevant Studies/Imaging: No orders to display     Discharge Medications:   Your Medication List     START taking these medications    lacosamide 100 mg Tab Commonly known as: VIMPAT Take 1 tablet (100 mg total) by mouth two (2) times a day.   lacosamide 100 mg Tab Commonly known as: VIMPAT Take 1 tablet (100 mg total) by mouth two (2) times a day. Start taking on: August 21, 2023       CONTINUE taking these medications    acetaminophen  500 MG tablet Commonly  known as: TYLENOL  Take 2 tablets (1,000 mg total) by mouth Three (3) times a day as needed.   ARISTADA 882 mg/3.2 mL Sers IM injection Generic drug: aripiprazole  lauroxil ER Inject 3.2 mL (882 mg total) into the muscle every twenty-eight (28) days.   valACYclovir  1000 MG tablet Commonly known as: VALTREX  Take 1 tablet (1,000 mg total) by mouth daily as needed.        Discharge Instructions:   You were admitted to [facility] with symptoms of body shaking, eye rolling, and tongue biting concerning for possible seizure. EEG (a measure of the electrical activity of your brain) revealed some focal slowing of in a region of the L temporal lobe of the  brain but no definitive seizure. You were treated with Vimpat, an anti-seizure medication, before discharge back home and then given a prescription for Vimpat to take at home.   Please attend all scheduled follow-up appointments and take your medications as prescribed. If you do not see an appointment listed below with your primary care doctor, please call your doctor's office as soon as possible to schedule an appointment to be seen within 7-10 days of discharge for a post-hospital follow-up.   If you do not hear from the [facility] regarding a follow-up appointment within 1 to 2 weeks after your hospital discharge, please call: Neurology Clinic [phone]  For questions regarding outpatient physical therapy/occupational therapy/speech therapy appointments, please call: [facility] - [phone]  Call emergency medical services or 911 if you have new or worsening:     Suddenly feel numb or weak in the face, arm or leg, especially on one side of the body         Suddenly have trouble seeing with one eye or both of them     Suddenly have a hard time talking or understanding what someone is saying     Have a sudden, very bad headache that does not go away with no known cause     Loss of consciousness     Seizure-like activity  For non-emergency symptoms or questions outside of clinic hours, you can reach the neurology resident on-call by contacting [facility] at [phone] and requesting neurology.     Appointments which have been scheduled for you    Aug 04, 2023 10:55 AM (Arrive by 10:45 AM) RETURN CONTINUITY with Waddell CHRISTELLA Alert, MD [facility] [place] [facility]) [address] [place] [address] [phone]     Aug 25, 2023 2:40 PM RETURN CARDIOLOGY with Margery Ruth, MD [facility] CARDIOLOGY AT [facility] REGION) [address] [place] [address] [phone]     Sep 29, 2023  8:10 AM (Arrive by 7:45 AM) NEW  EPILEPSY with Penne Ruth Samples, MD [facility] [address] [place] [facility] REGION) [address] [place] [address] [phone]          I spent greater than 30 minutes in the discharge of this patient.  Delon Seeds, MD Physical Medicine and Rehabilitation PGY-1   NEUROLOGY ATTENDING ATTESTATION  Briefly, Larry Ball is a 28 y.o. man with biopolar disorder and spells that have been intermittently occurring since childhood who was admitted for spell capture after multiple recent ED visits for spells of different semiologies.  cvEEG showed three electrographic seizures from the left temporal lobe with behavioral arrest, but there were no typical spells captured.  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  However, he may still benefit from an EMU admission to further characterize his typical events if these persist while on treatment.  I saw and evaluated the patient, participating in the key portions of the service.  I reviewed the residents note.  I agree with the residents findings and plan.   Cathryne JONELLE Schneider, MD, MHS Associate Professor of Neurology  "

## 2023-07-26 NOTE — ED Provider Notes (Signed)
 [facility] Emergency Department Provider Note   ED Clinical Impression   Final diagnoses:  None    Impression, Medical Decision Making, ED Course   Impression: 28 y.o. 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  Presented to the ED due 2 witnessed seizures while on p.o. Vimpat as described below.   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  Given recent admission will order CBC, BMP, mag.  Will get a urine tox as well as a UA.  Will forego MRI brain as he has had extensive workup to this point which is all been negative.  Will page neurology for further evaluation and consideration for admission given resistance to AMS medications.  After further discussion with neurology recommending direct admission to EMU unit for further EEG evaluation.  Diagnostic workup as below.   Orders Placed This Encounter  Procedures   Toxicology Screen, Urine   Urinalysis with Microscopy (Clean Catch)   Basic Metabolic Panel   CBC w/ Differential   Magnesium    Vital signs   Inpatient consult to Neurology   ECG 12 Lead   ED Course as of 07/26/23 1338  Mon Jul 26, 2023  1114 Vital signs WNL  1114 CBC within normal limits  1114 BMP with elevated CR 1.31 at baseline  1114 Neurology consulted, spoke and they plan to evaluate patient  1154 U-Tox negative  1155 UA unremarkable  1250 Spoke with neurology. Plan offer direct admission to the EMU for this patient pending discharges.    MDM Elements        ____________________________________________  The case was discussed with the attending physician, who is in agreement with the above assessment and plan.    History   Chief Complaint Chief Complaint  Patient presents with   Seizure - Prior History Of    HPI  DEMARIOUS Ball is a 28 y.o. male with past medical history as below who presents with 7 months of worsening seizure-like activity with 2 witnessed seizures over the last 24 hours.  Patient has had extensive workup for seizure-like activity with MRI brain to rule out structural CNS pathology, TTE, and cardiac monitoring which have all been negative thus far.  He was recently admitted to [facility] neurology where he had a continuous video EEG for 48 hours which showed 3 brief seizure left temporal lobe activity with left temporal lobe slowing.  He was given a load of IV lacosamide put on 100 mg Vimpat twice daily.  Patient states that he has been taking his p.o. medications as prescribed and has not noticed an improvement in his seizure activity.  Per neurology's note on 7/10 and he has been considered for EMU admission if he continues to have episodes devalued being on ASM.  He reports that he has recently had a injection of his Jann 3 days ago.  He does note that he feels that his anxiety has been worsening where he is had difficulty concentrating, not wanting to do things he enjoys, has stayed home rather than going out.  Difficulty sleeping.  Denies any SI/HI  Denies fever, chills, vision changes, chest pain, shortness of breath, abdominal pain, N/V/D/C, dysuria, or lower extremity edema  Outside Historian(s): I have obtained additional history/collateral from patient/patient's father.  Past Medical History[1]  Past Surgical History[2]  Active Medications[3]   Allergies[4]  Family History[5]  Short Social History[6]   Physical Exam   VITAL SIGNS:    Vitals:   07/26/23 0946  BP: 132/89  Pulse: 94  Resp: 20  Temp: 37.2 C (98.9 F)  TempSrc: Oral  SpO2: 98%    Constitutional: Alert and oriented. No acute distress. Eyes: Conjunctivae are normal. HEENT: Normocephalic and atraumatic. Conjunctivae clear. No congestion. Moist mucous membranes.  Cardiovascular: Rate as above, regular rhythm.  Normal and symmetric distal pulses. Brisk capillary refill. Normal skin turgor. Respiratory: Normal respiratory effort. Breath sounds are normal. There are no wheezing or crackles heard. Gastrointestinal: Soft, non-distended, non-tender, +BS. Genitourinary: Deferred. Musculoskeletal: Non-tender with normal range of motion in all extremities. Neurologic: Normal speech and language. No gross focal neurologic deficits are appreciated.  Has bilateral upper extremity lower extremity tremors. Face is symmetric at rest and with speech. Skin: Skin is warm, dry and intact. No rash noted. Psychiatric: Mood appropriate, flat affect. Speech and behavior are normal.   Radiology   No orders to display    Pertinent labs & imaging results that were available during my care of the patient were independently interpreted by me and considered in my medical decision making (see chart for details).  Portions of this record have been created using Scientist, clinical (histocompatibility and immunogenetics). Dictation errors have been sought, but may not have been identified and corrected.        [1] Past Medical History: Diagnosis Date   ADHD (attention deficit hyperactivity disorder)    Bipolar disorder       Memory difficulties 10/20/2016   Suicide attempt by other psychotropic drug overdose    01/08/2023  [2] Past Surgical History: Procedure Laterality Date   PR KNEE SCOPE,AID ANT CRUCIATE REPAIR Right 02/23/2014   Procedure: ARTHROSCOPICALLY AIDED ANTERIOR CRUCIATE LIGAMENT REPAIR/AUGMENTATION OR RECONSTRUCTION;  Surgeon: Reyes ONEIDA Raymond, MD;  [facility]: [facility];  Service: Orthopedics   PR KNEE SCOPE,MED/LAT MENISECTOMY Right 02/23/2014   Procedure: ARTHROSCOPY, KNEE; W/MENISECT(MED/LAT, INCL MENISCAL SHAVE) W/DEBRIDE/SHAVE ARTICULAR CART(CHONDROPLASTY);  Surgeon: Reyes ONEIDA Raymond, MD;  Location: [facility];  Service: Orthopedics  [3] No current facility-administered medications for this encounter.   Current Outpatient  Medications  Medication Sig Dispense Refill   acetaminophen  (TYLENOL ) 500 MG tablet Take 2 tablets (1,000 mg total) by mouth Three (3) times a day as needed. 90 tablet 0   aripiprazole  lauroxil ER (ARISTADA) 882 mg/3.2 mL sers IM injection Inject 3.2 mL (882 mg total) into the muscle every twenty-eight (28) days.     lacosamide (VIMPAT) 100 mg Tab Take 1 tablet (100 mg total) by mouth two (2) times a day. 60 tablet 0   [START ON 08/21/2023] lacosamide (VIMPAT) 100 mg Tab Take 1 tablet (100 mg total) by mouth two (2) times a day. 60 tablet 1   valACYclovir  (VALTREX ) 1000 MG tablet Take 1 tablet (1,000 mg total) by mouth daily as needed.    [4] Allergies Allergen Reactions   Phenergan [Promethazine]   [5] Family History Problem Relation Age of Onset   Hypertension Mother    Liver disease Mother    Hypertension Father    Heart attack Father    Other (pacemaker) Father    Stroke Paternal Grandmother   [6] Social History Tobacco Use   Smoking status: Never    Passive exposure: Current (Pt reports his father smokes and they live together.)   Smokeless tobacco: Never  Vaping Use   Vaping status: Never Used  Substance Use Topics   Alcohol use: No  Drug use: No   Oley Skates, MD Resident 07/26/23 [phone]

## 2023-07-30 NOTE — Discharge Summary (Addendum)
 Attestation signed by Penne Samples, MD   Attending Attestation Briefly, 28 yo M with history of bipolar disorder (on Aristada) with multiple recent ED visits for confusion, syncope, and seizure lie activity.  ~2 week prior to EMU admission, pt was admitted and found to have left temporal seizures, largely subclinical.  Pt also has episodes of eye rolling back in head, dizziness, loss of consciousness which family reported that started in childhood, then resolved for some years before returning more recently.  These episodes were not seen on vEEG on prior admission or this admission.   On this admission, pt/ family report persistently worsening tremor over the last few weeks such that whole body tremor can be seen on exam today (including tongue and eyelid).  Pt reports recent worsening sleep patterns, which some nights of no sleep.  Family also report recent personality changes.  EEG demonstrate diffuse slowing and left temporal focal slowing + occasional Left temporal epileptiform discharges but no seizures.   Overall, complex clinical presentation.  Psychiatry was consulted for recommendations regarding Aristada management given concern for EPS vs drug induced parkinsonism.  Appreciate recommendations.  Both propranolol and hydroxyzine  were initiated for management of tremor; Pt/ family prefer to continue hydroxyzine .  Discussion with movement disorder colleagues, there is increased concern that new formulation of Aristada is substantially contributing to tremors.  Pending results of additional work-up, will likely need to switch off Aristada to other formulations of aripiprazole  vs other agent.  On discharge, serum autoimmune encephalopathy panel and urine heavy metal screen are pending.  We will also place referral to neurogenetics consultation as well as movement disorders.  We have ordered [facility] Ambulatory EEG to capture events of question given regular trigger at home.  Advised patient to avoid  heat as best as possible at home, particularly given ongoing use of neuroleptic.  Otherwise, Pt already has follow-up with me in September.  I saw and evaluated the patient, participating in the key portions of the service.  I reviewed the NPs note, I agree with the findings and plan.  It was medically necessary for me to see the patient in addition to the APP. I personally performed the substantive portion of this visit, which included MDM.   Penne Samples, MD.       EMU Discharge Summary     Admit date: 07/26/2023  Discharge date: July 30, 2023   Length of Stay: 4  Discharge to: Home  Discharge Service: Neurology (NEU)  Discharge Physician: Penne Samples, MD  Discharge Diagnoses: Focal Onset Epilepsy Diffuse Tremors,unknown etiology Bipolar disorder  Procedures:  - Continous video EEG monitoring - Continous remote telemetry monitoring - Peripheral intravascular venous insertion. - Continous pulse ox monitoring  Consults: Psychiatry, Case manager, Physical therapy, occupational therapy.  Pertinent Test Results:  Results for orders placed or performed during the hospital encounter of 07/26/23  Toxicology Screen, Urine  Result Value Ref Range   Amphetamines Screen, Ur Negative <500 ng/mL   Barbiturates Screen, Ur Negative <200 ng/mL   Benzodiazepines Screen, Urine Negative <200 ng/mL   Cannabinoids Screen, Ur Negative <20 ng/mL   Methadone Screen, Urine Negative <300 ng/mL   Cocaine(Metab.)Screen, Urine Negative <150 ng/mL   Opiates Screen, Ur Negative <300 ng/mL   Fentanyl Screen, Ur Negative <1.0 ng/mL   Oxycodone  Screen, Ur Negative <100 ng/mL   Buprenorphine, Urine Negative <5 ng/mL  Urinalysis with Microscopy (Clean Catch)  Result Value Ref Range   Color, UA Light Yellow    Clarity, UA Cloudy  Specific Gravity, UA 1.023 1.003 - 1.030   pH, UA 8.0 5.0 - 9.0   Leukocyte Esterase, UA Negative Negative   Nitrite, UA Negative Negative   Protein, UA  Negative Negative   Glucose, UA Negative Negative   Ketones, UA Negative Negative   Urobilinogen, UA <2.0 mg/dL <7.9 mg/dL   Bilirubin, UA Negative Negative   Blood, UA Negative Negative   RBC, UA <1 <=3 /HPF   WBC, UA <1 <=2 /HPF   Squam Epithel, UA <1 0 - 5 /HPF   Bacteria, UA Few (A) None Seen /HPF  Basic Metabolic Panel  Result Value Ref Range   Sodium 141 135 - 145 mmol/L   Potassium 4.6 3.4 - 4.8 mmol/L   Chloride 105 98 - 107 mmol/L   CO2 27.0 20.0 - 31.0 mmol/L   Anion Gap 9 5 - 14 mmol/L   BUN 11 9 - 23 mg/dL   Creatinine 8.69 (H) 9.26 - 1.18 mg/dL   BUN/Creatinine Ratio 8    eGFR CKD-EPI (2021) Male 77 >=60 mL/min/1.43m2   Glucose 86 70 - 179 mg/dL   Calcium 9.8 8.7 - 89.5 mg/dL  Magnesium   Result Value Ref Range   Magnesium  1.9 1.6 - 2.6 mg/dL  Lacosamide  Result Value Ref Range   Lacosamide 1.9 1.0 - 10.0 mcg/mL  CBC  Result Value Ref Range   WBC 5.7 3.6 - 11.2 10*9/L   RBC 4.72 4.26 - 5.60 10*12/L   HGB 14.6 12.9 - 16.5 g/dL   HCT 58.2 60.9 - 51.9 %   MCV 88.3 77.6 - 95.7 fL   MCH 30.9 25.9 - 32.4 pg   MCHC 35.0 32.0 - 36.0 g/dL   RDW 86.0 87.7 - 84.7 %   MPV 8.4 6.8 - 10.7 fL   Platelet 291 150 - 450 10*9/L  Comprehensive Metabolic Panel  Result Value Ref Range   Sodium 141 135 - 145 mmol/L   Potassium 4.1 3.4 - 4.8 mmol/L   Chloride 105 98 - 107 mmol/L   CO2 28.0 20.0 - 31.0 mmol/L   Anion Gap 8 5 - 14 mmol/L   BUN 13 9 - 23 mg/dL   Creatinine 8.56 (H) 9.26 - 1.18 mg/dL   BUN/Creatinine Ratio 9    eGFR CKD-EPI (2021) Male 68 >=60 mL/min/1.12m2   Glucose 103 70 - 179 mg/dL   Calcium 9.6 8.7 - 89.5 mg/dL   Albumin 3.6 3.4 - 5.0 g/dL   Total Protein 7.0 5.7 - 8.2 g/dL   Total Bilirubin 0.3 0.3 - 1.2 mg/dL   AST 13 <=65 U/L   ALT 17 10 - 49 U/L   Alkaline Phosphatase 36 (L) 46 - 116 U/L  Ceruloplasmin  Result Value Ref Range   Ceruloplasmin 24.0 15.0 - 52.0 mg/dL  Vitamin B12 Level  Result Value Ref Range   Vitamin B-12 668 211 - 911 pg/ml   TSH  Result Value Ref Range   TSH 1.964 0.550 - 4.780 uIU/mL  T4, Total (Thyroxine)  Result Value Ref Range   T4, Total 5.40 4.50 - 10.90 ug/dL  T3, Free  Result Value Ref Range   T3, Free 2.99 2.30 - 4.20 pg/mL  Cortisol  Result Value Ref Range   Cortisol 17.2 See Comment ug/dL  ECG 12 Lead  Result Value Ref Range   EKG Systolic BP  mmHg   EKG Diastolic BP  mmHg   EKG Ventricular Rate 71 BPM   EKG Atrial Rate 71 BPM  EKG P-R Interval 160 ms   EKG QRS Duration 102 ms   EKG Q-T Interval 344 ms   EKG QTC Calculation 373 ms   EKG Calculated P Axis 65 degrees   EKG Calculated R Axis 21 degrees   EKG Calculated T Axis 17 degrees   QTC Fredericia 364 ms  POCT Glucose  Result Value Ref Range   Glucose, POC 75 70 - 179 mg/dL  POCT Glucose  Result Value Ref Range   Glucose, POC 104 70 - 179 mg/dL  POCT Glucose  Result Value Ref Range   Glucose, POC 92 70 - 179 mg/dL  CBC w/ Differential  Result Value Ref Range   WBC 6.1 3.6 - 11.2 10*9/L   RBC 4.94 4.26 - 5.60 10*12/L   HGB 15.3 12.9 - 16.5 g/dL   HCT 56.7 60.9 - 51.9 %   MCV 87.5 77.6 - 95.7 fL   MCH 30.9 25.9 - 32.4 pg   MCHC 35.3 32.0 - 36.0 g/dL   RDW 86.2 87.7 - 84.7 %   MPV 8.2 6.8 - 10.7 fL   Platelet 324 150 - 450 10*9/L   Neutrophils % 69.0 %   Lymphocytes % 25.0 %   Monocytes % 4.8 %   Eosinophils % 0.7 %   Basophils % 0.5 %   Absolute Neutrophils 4.2 1.8 - 7.8 10*9/L   Absolute Lymphocytes 1.5 1.1 - 3.6 10*9/L   Absolute Monocytes 0.3 0.3 - 0.8 10*9/L   Absolute Eosinophils 0.0 0.0 - 0.5 10*9/L   Absolute Basophils 0.0 0.0 - 0.1 10*9/L    ECG 12 Lead Result Date: 07/27/2023 NORMAL SINUS RHYTHM WITH SINUS ARRHYTHMIA NORMAL ECG WHEN COMPARED WITH ECG OF 20-Jul-2023 08:10, NO SIGNIFICANT CHANGE WAS FOUND Confirmed by Antonetta Gull ([id number]) on 07/27/2023 6:43:06 AM   Pending Test Results:  Pending Labs     Order Current Status   ACTH In process   Encephalopathy, Autoimmune/Paraneoplastic Eval, Serum  In process   Heavy Metals Screen, Random Urine In process       Hospital Course:  Larry Ball is a  history of bipolar disorder (on Aristada) with multiple recent ED visits for confusion, syncope, and seizure lie activity.  ~2 weeks ago, pt was admitted and found to have left temporal seizures, largely subclinical.  Pt also has episodes of eye rolling back in head, dizziness, loss of consciousness which family reported that started in childhood, then resolved for some years before returning more recently. who was admitted to [facility] Monitoring [facility] on 07/26/23 for characterization of his events.   During this admission the patient's home AEDs were tapered down and activating procedures were initiated to provoke typical seizures for further characterization.  07/26/23: Transferred from ED to EMU. CvEEG. ASM taper: 100 mg BID. Seizure provoking measures: none. 07/27/23: cEEG - left temporal epileptiform discharges in sleep. No seizures. ASMs - Received 5 mg IV Midazolam and Vimpat increased to 150 mg AM due to conflict with EEG finding with the Night treatment team.  Seizure provoking measures: Photic stimulation - provoked severe anxiety. One time dose of Atarax  given for anxiety relief.Sleep deprivation deferred.  Continued Vimpat at home dose 100 mg BID.  Psychiatry consulted to rule out possible drug induced tremor from Aristada versus autoimmune pathology.   Orthostatic vitals unremarkable (see objective section) 07/28/23: cvEEG - no seizures. Occasional left temporal epileptiform discharges. ASMs - Vimpat 100 mg BID. Seizure provoking measures none.  Atarax  25 mg TID to monitor any  improvement in tremors. 07/29/23: cvEEG - Occasional left temporal epileptiform discharges. ASMs - Vimpat 100 mg BID, Seizure provoking measures none.  Initiated Propanolol 10 mg TID for tremor control.  07/30/23: cvEEG - no seizures. For tremors - patient reported that he increased benefit from Atarax  compared  to Propanol.   Discharge ASMs: Vimpat 100 mg BID For tremors  - Atarax  25mg  TID   Follow up:   Dr. Penne Samples in [facility] 09/29/23 [facility] Movement disorder clinic 3.   Ambulatory Video EEG monioring - [facility] 4.   Referral to [facility]  cvEEG: Please see the EEG report for full detailed results. In brief, Abnormal awake and asleep EEG due to: - Rare to occasional left temporal epileptiform discharges in sleep - Occasional left temporal focal slowing - Mild to moderate diffuse background slowing the 2 PB activations early on 07/27/23 did not have clear epileptic seizure semiology or ictal EEG correlate.  As such, these events are not epileptic in etiology and likely represent state transitions.  The 3rd event on 7/17 did not have ictal EEG correlate; as such this event is not clearly epileptic in etiology.   EMU Protocol was maintained during this admission: seizure precautions including padded siderails, cardiac telemetry, falls precautions with bed alarm,OOB with assistance only, nursing seizure testing, and frequent vital signs and neuro check. Maintaining a peripheral IV access.  Lovenox and SCD were used as DVT prophylaxis.    Following chronic medical conditions were monitored during this admission:   Bipolar disorder - Takes Aristada started 6-7 months ago possibly contributing to Tremors.  Continous Diffuse Tremors -Atarax  25 mg TID orn -Started Propanolol 07/29/23 but patient preferred Atarax  hence switched back to Atarax  25 mg TID scheduled   EMU Quality Measures 1)   Does the patient have medically refractory epilepsy?: No 2)   Is the patient appropriate for presurgical evaluation for medically refractory epilepsy?: No 3)   Was surgical intervention for medically refractory epilepsy discussed?: No 4)   Hospital Length of Stay:4    Condition at Discharge: good  Discharge Exam:  Temp:  [36.6 C (97.9 F)-37.1 C (98.8 F)] 36.8 C (98.2 F) Pulse:   [53-79] 79 Resp:  [16-20] 18 BP: (111-130)/(62-87) 130/75 SpO2:  [90 %-98 %] 90 %   Orthostatic vitals                         BP                   HR Supine             118/66             81 Sitting              130/73             88 Standing          128/70             88   Physical Exam: General Appearance: No acute distress Mood: Flat affect HEENT: Head is atraumatic and normocephalic. Sclera anicteric without injection. Oropharyngeal membranes are moist with no erythema or exudate. Neck: Supple. Lungs: Normal work of breathing. Clear to auscultation in anterior chest Heart: Regular rate and rhythm.  Abdomen: nondistended. Extremities: No clubbing, cyanosis, or edema.   Neurological Examination:    Mental Status: Alert, conversant, able to follow conversation and interview. Spontaneous speech was fluent without word finding  pauses, dysarthria, or paraphasic errors. Comprehension was intact. Memory for recent and remote events was intact.   Cranial Nerves: Left eye extropia (lid lag) PERRL. Pursuit eye movements were uninterrupted with full range and without more than end-gaze nystagmus. Facial sensation intact bilaterally to light touch in all three divisions of CNV. Face symmetric at rest. Normal facial movement bilaterally, including forehead, eye closure and grimace/smile. Hearing intact to conversation. Shoulder shrug full strength bilaterally. Palate movement is symmetric. Tongue protrudes midline and tongue movements are normal.    Motor Exam: Normal bulk. No tremors, myoclonus, or other adventitious movement. Pronator drift is absent. RUE: 5/5 deltoid, 5/5 biceps, 5/5 triceps, 5/5 hand grip LUE: 5/5 deltoid, 5/5 biceps, 5/5 triceps, 5/5 hand grip RLE: 5/5 iliopsoas, 5/5 quadriceps, 5/5 hamstrings, 5/5 plantar flexion, 5/5 dorsiflexion LLE: 5/5 iliopsoas, 5/5 quadriceps, 5/5 hamstrings, 5/5 plantar flexion, 5/5 dorsiflexion Right knee pain from ACL tear   Reflexes: DTRs  are 2+ and symmetric throughout. Deferred Right Knee due to knee surgery Toes are downgoing bilaterally.   Sensory: Grossly intact to light touch in all extremities.      Cerebellar/Coordination/Gait: Finger-to-nose is normal without ataxia or dysmetria bilaterally.   Casual gait normal  No clinical seizures noted during exam.   Discharge Medications:   Your Medication List     START taking these medications    hydrOXYzine  25 MG tablet Commonly known as: ATARAX  Take 1 tablet (25 mg total) by mouth Three (3) times a day.       CONTINUE taking these medications    acetaminophen  500 MG tablet Commonly known as: TYLENOL  Take 2 tablets (1,000 mg total) by mouth Three (3) times a day as needed.   ARISTADA 882 mg/3.2 mL Sers IM injection Generic drug: aripiprazole  lauroxil ER Inject 3.2 mL (882 mg total) into the muscle every twenty-eight (28) days.   lacosamide 100 mg Tab Commonly known as: VIMPAT Take 1 tablet (100 mg total) by mouth two (2) times a day.   lacosamide 100 mg Tab Commonly known as: VIMPAT Take 1 tablet (100 mg total) by mouth two (2) times a day. Start taking on: August 21, 2023   valACYclovir  1000 MG tablet Commonly known as: VALTREX  Take 1 tablet (1,000 mg total) by mouth daily as needed.        Discharge Instructions:   Other Instructions     Discharge instructions     You have been admitted to [facility] for evaluation and treatment of seizures with video EEG monitoring.  When you leave the hospital, please take your medications as prescribed. We have sent a 30-day supply of Hydroxyzine  to [facility]. Please observe caution when taking this medication asit can make you sleepy.   We made the following referrals for you. You should here from them 1. Remote video EEG monitoring 2. Referral to Movement Disorder clinic to further evaluate your tremor. 3. Referral to [facility].  You have a follow up appointment with Dr. Penne Samples, MD on 09/29/2023.  If your family observes any of your events with eye rolling, dizziness and passing out spells,please record on your phone and upload to [facility].  You do not have to call EMS / 911 for events that do not last more than 7 minutes.  For smaller spells of 1-2 minutes keep a record and notify out office.   Seizures may happen at any time. It is important to take certain precautions to maintain your safety. When possible, take showers  instead of baths, as it is possible to drown in even shallow water during a seizure. Do not swim unsupervised or in open water where rescue could be difficult. Do not climb to heights and do not operate heavy machinery. When cooking, use the back burners of the stove and avoid open flames or hot stove tops. Avoid any activities which could be dangerous in the event of a loss of consciousness.  Do not drive until approved by your neurologist.  We wish you the best of recoveries [facility] Neurology Team.     Follow Up instructions and Outpatient Referrals    Discharge instructions      Appointments which have been scheduled for you    Aug 04, 2023 10:55 AM (Arrive by 10:45 AM) RETURN CONTINUITY with Waddell CHRISTELLA Alert, MD [facility] [place] [facility]) [address] [place] [address] [phone]     Aug 25, 2023 2:40 PM RETURN CARDIOLOGY with Margery Ruth, MD [facility] CARDIOLOGY AT [facility] REGION) [address] [place] [address] [phone]     Sep 09, 2023 2:45 PM (Arrive by 2:20 PM) RETURN NEUROLOGY with Ariel Muriel GAILS, MD [facility] NEUROLOGY CL[facility] [address] [place] [facility] REGION) [address] [place] [address] [phone]     Sep 29, 2023 8:10 AM (Arrive by 7:45 AM) NEW  EPILEPSY with Penne Ruth Samples, MD [facility] NEUROLOGY CLINIC [address] [place] [facility] REGION) [address] [place] [address] [phone]          I directly provided 60  minutes of acute care time as documented in this note. Time includes: direct patient care, patient reassessment, coordination of patient care, interpretation of data, review of patient medical records, patient education, counseling and documentation of patient care. This time is exclusive of separately billable procedures.  Charmain Marker, NP Department of Neurology

## 2023-09-07 IMAGING — CR DG CHEST 2V
3 series · 3 of 3 positions shown · non-contrast
Comparison: November 28, 2020

CLINICAL DATA: Productive cough and congestion x2 weeks.

EXAM:
CHEST - 2 VIEW

[chest pa (1 of 2)]
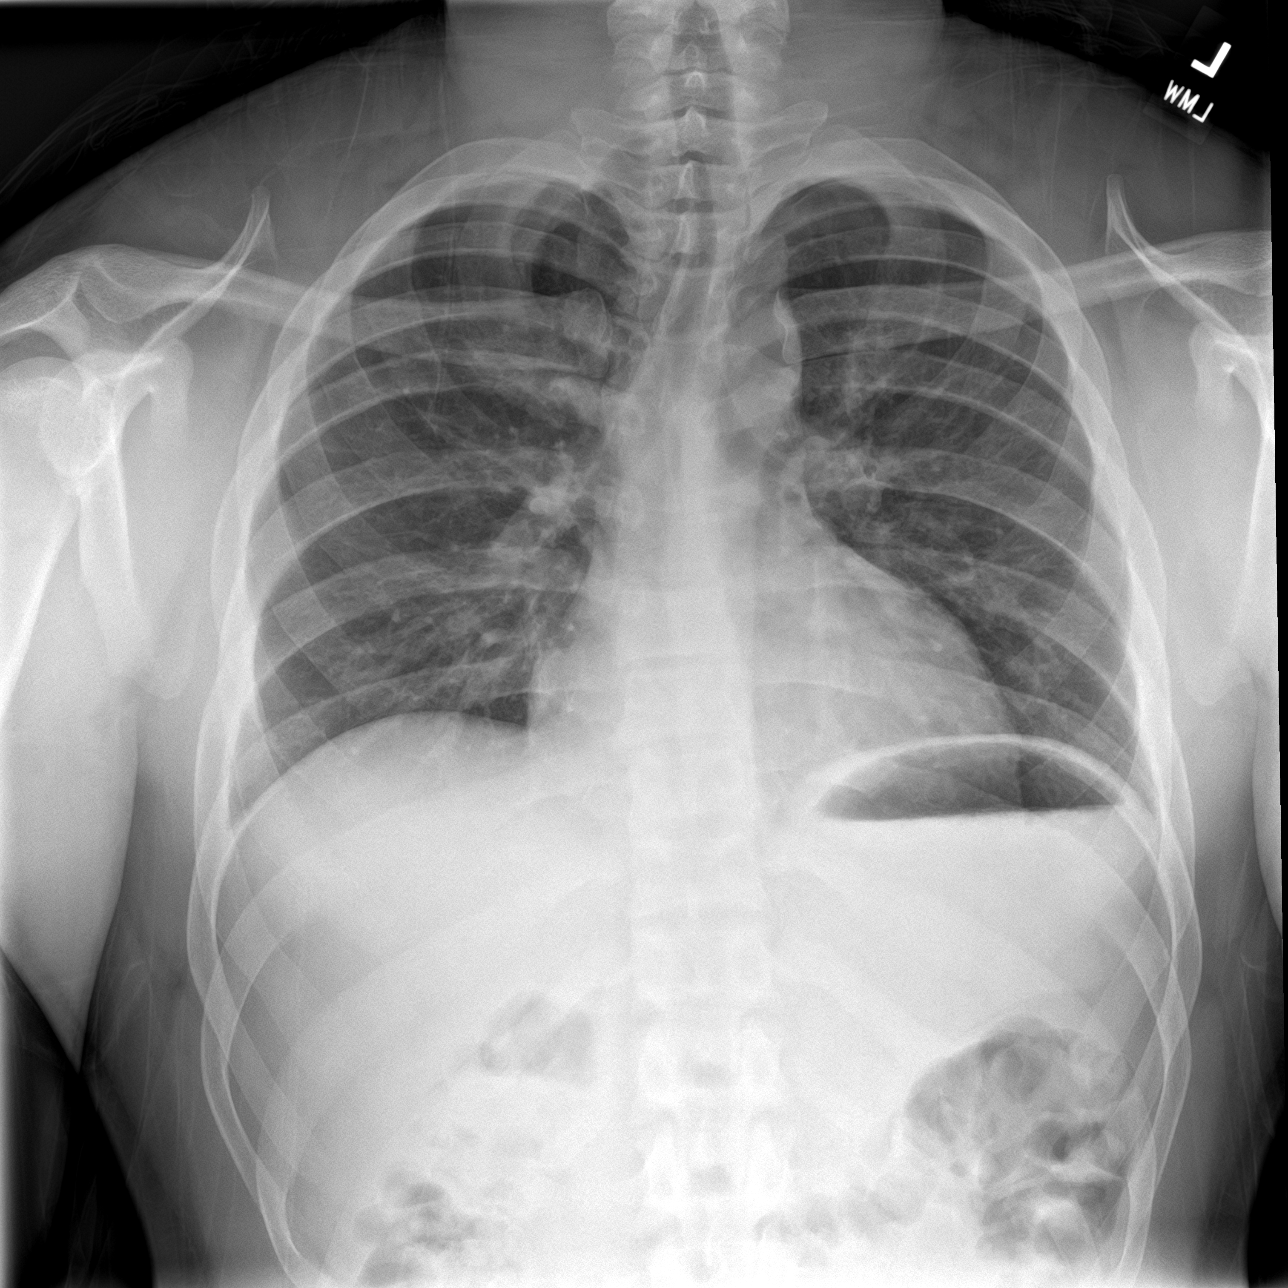

[chest lat]
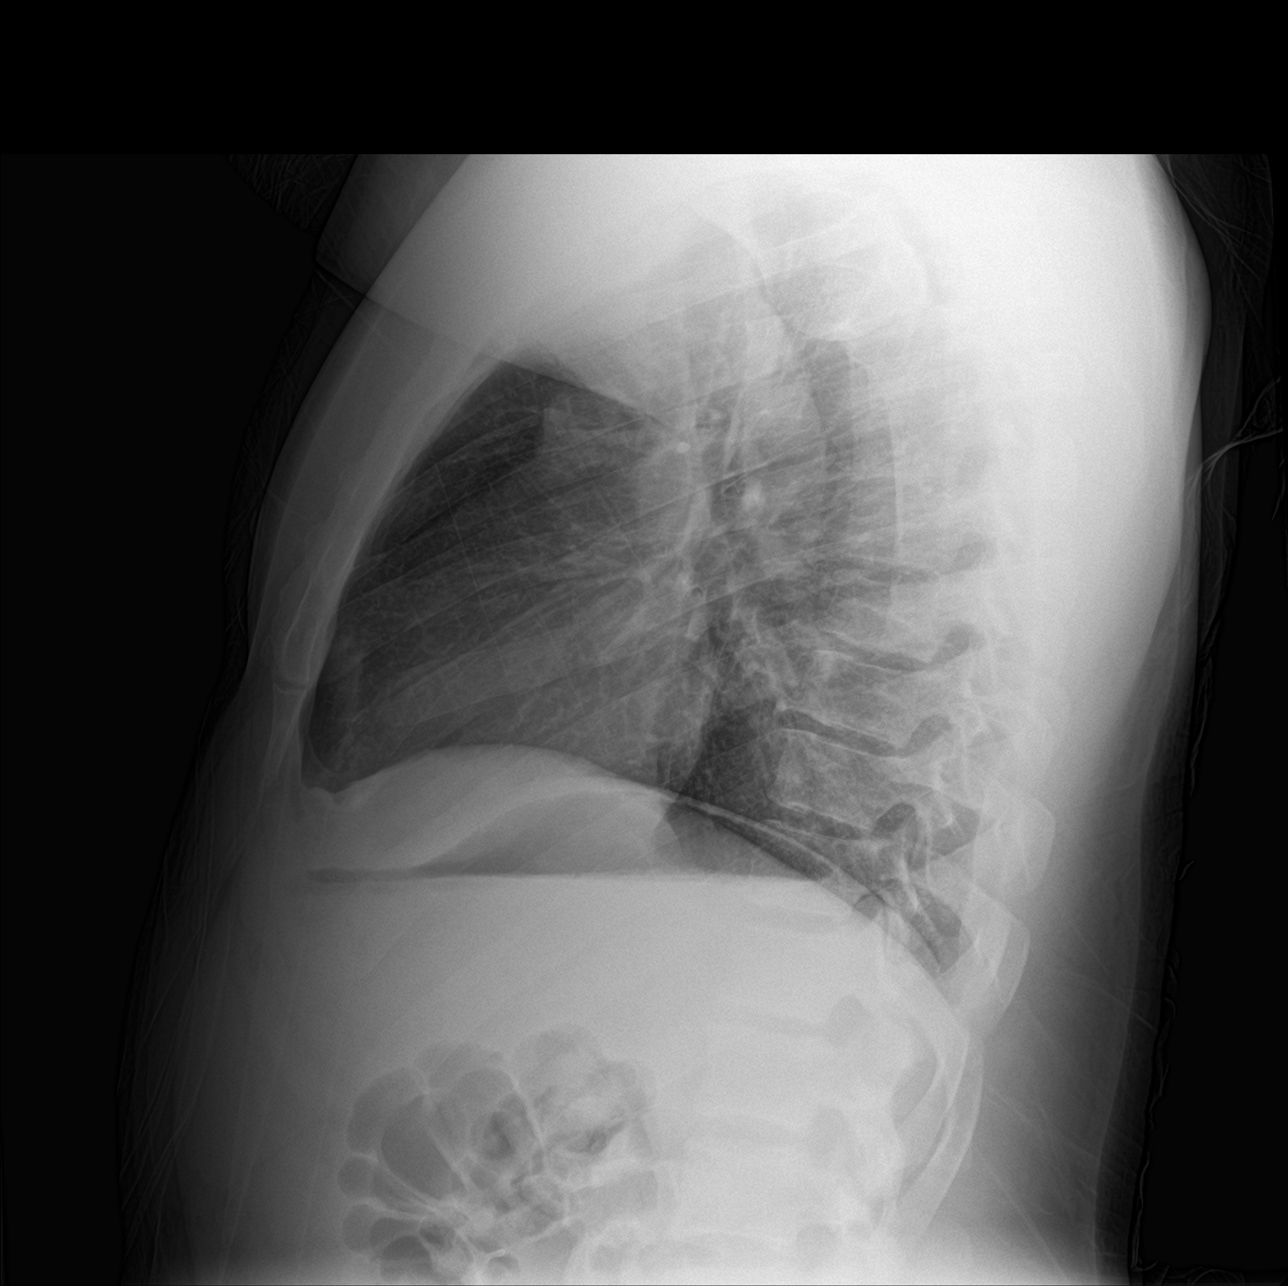

[chest pa (2 of 2)]
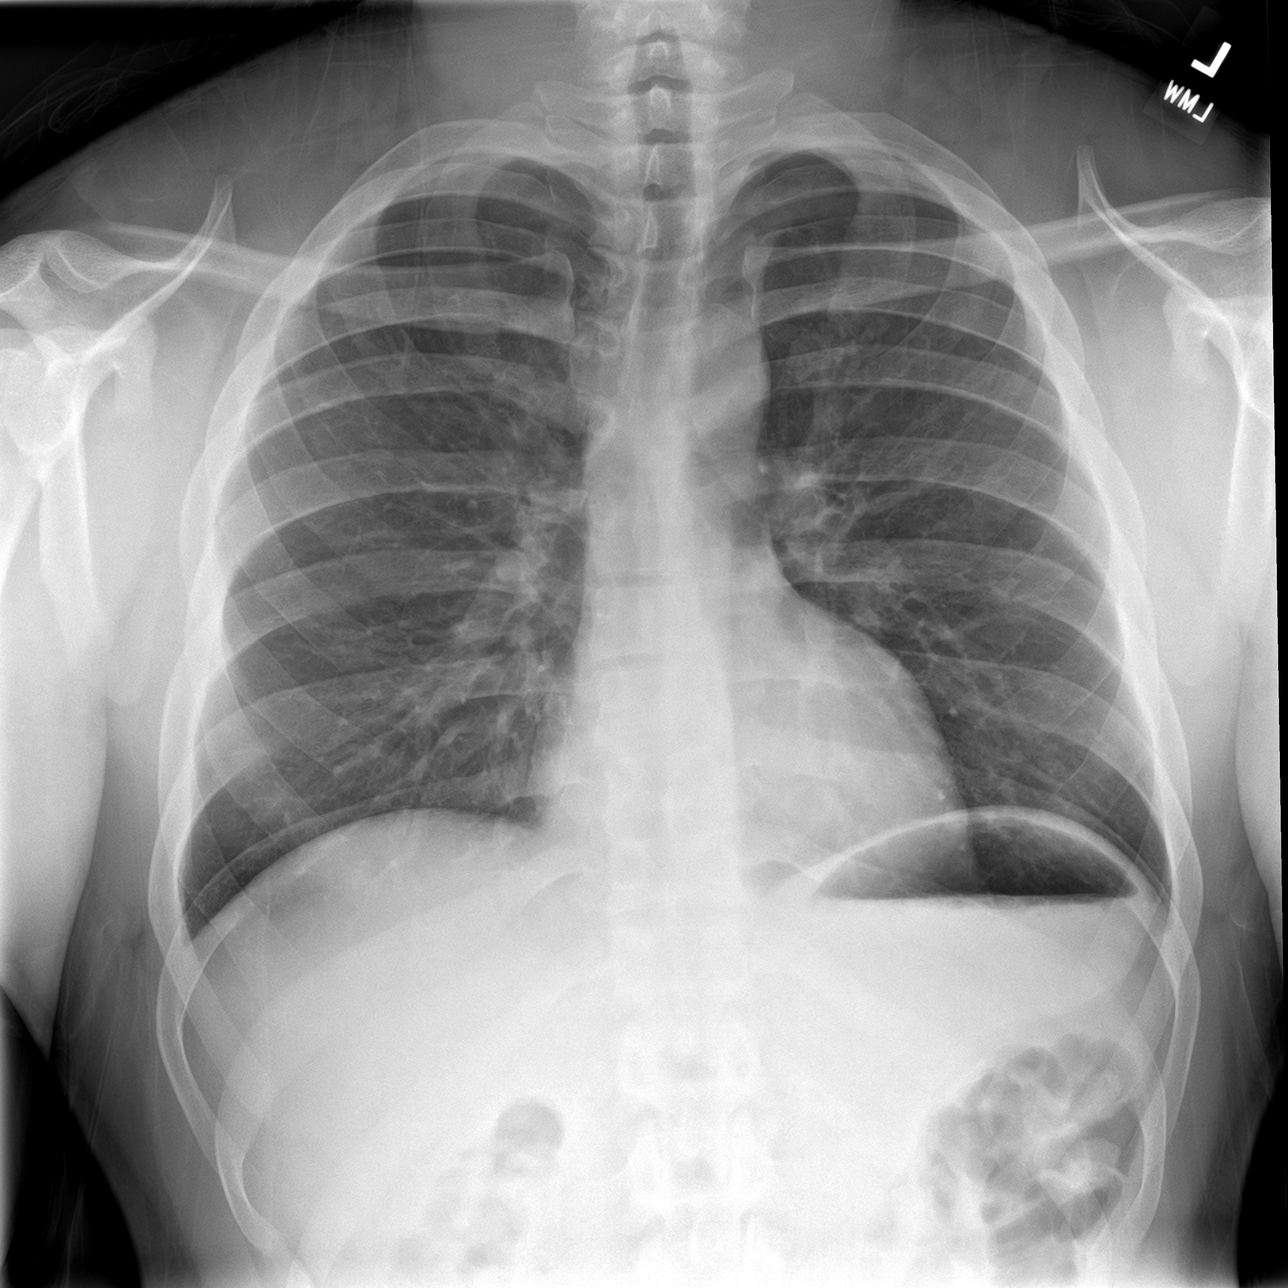

[3 of 3 positions shown; findings below may reference images not displayed]

FINDINGS: Low lung volumes are seen without evidence of acute infiltrate,
pleural effusion or pneumothorax. The heart size and mediastinal
contours are within normal limits. The visualized skeletal
structures are unremarkable.
IMPRESSION: No active cardiopulmonary disease.

## 2023-11-15 ENCOUNTER — Encounter: Payer: Self-pay | Admitting: Radiology

## 2024-02-04 ENCOUNTER — Other Ambulatory Visit: Payer: Self-pay

## 2024-02-04 ENCOUNTER — Emergency Department
Admission: EM | Admit: 2024-02-04 | Discharge: 2024-02-04 | Disposition: A | Discharge status: Home / Self Care | Attending: Emergency Medicine | Admitting: Emergency Medicine

## 2024-02-04 ENCOUNTER — Emergency Department

## 2024-02-04 DIAGNOSIS — X500XXA Overexertion from strenuous movement or load, initial encounter: Secondary | ICD-10-CM | POA: Insufficient documentation

## 2024-02-04 DIAGNOSIS — S29011A Strain of muscle and tendon of front wall of thorax, initial encounter: Secondary | ICD-10-CM | POA: Diagnosis not present

## 2024-02-04 DIAGNOSIS — Y9239 Other specified sports and athletic area as the place of occurrence of the external cause: Secondary | ICD-10-CM | POA: Insufficient documentation

## 2024-02-04 DIAGNOSIS — J45909 Unspecified asthma, uncomplicated: Secondary | ICD-10-CM | POA: Insufficient documentation

## 2024-02-04 DIAGNOSIS — Y9389 Activity, other specified: Secondary | ICD-10-CM | POA: Insufficient documentation

## 2024-02-04 DIAGNOSIS — M79601 Pain in right arm: Secondary | ICD-10-CM

## 2024-02-04 DIAGNOSIS — M79621 Pain in right upper arm: Secondary | ICD-10-CM | POA: Diagnosis not present

## 2024-02-04 DIAGNOSIS — S199XXA Unspecified injury of neck, initial encounter: Secondary | ICD-10-CM | POA: Diagnosis present

## 2024-02-04 MED ORDER — ACETAMINOPHEN 500 MG PO TABS: 1000.0000 mg | ORAL_TABLET | Freq: Four times a day (QID) | ORAL | 0 refills | Status: DC | PRN

## 2024-02-04 MED ORDER — ACETAMINOPHEN 325 MG PO TABS
650.0000 mg | ORAL_TABLET | Freq: Once | ORAL | Status: AC
  Administered 2024-02-04: 650 mg via ORAL
  Filled 2024-02-04: qty 2

## 2024-02-04 MED ORDER — KETOROLAC TROMETHAMINE 30 MG/ML IJ SOLN
30.0000 mg | Freq: Once | INTRAMUSCULAR | Status: AC
  Administered 2024-02-04: 30 mg via INTRAMUSCULAR
  Filled 2024-02-04: qty 1

## 2024-02-04 MED ORDER — METHOCARBAMOL 500 MG PO TABS
1000.0000 mg | ORAL_TABLET | Freq: Once | ORAL | Status: AC
  Administered 2024-02-04: 1000 mg via ORAL
  Filled 2024-02-04: qty 2

## 2024-02-04 MED ORDER — MORPHINE SULFATE (PF) 2 MG/ML IV SOLN
2.0000 mg | Freq: Once | INTRAVENOUS | Status: AC
  Administered 2024-02-04: 2 mg via INTRAMUSCULAR
  Filled 2024-02-04: qty 1

## 2024-02-04 MED ORDER — MELOXICAM 15 MG PO TABS: 15.0000 mg | ORAL_TABLET | Freq: Every day | ORAL | 0 refills | Status: DC

## 2024-02-04 MED ORDER — METHOCARBAMOL 500 MG PO TABS: 500.0000 mg | ORAL_TABLET | Freq: Three times a day (TID) | ORAL | 0 refills | Status: AC | PRN

## 2024-02-04 MED ORDER — OXYCODONE-ACETAMINOPHEN 5-325 MG PO TABS
1.0000 | ORAL_TABLET | Freq: Once | ORAL | Status: AC
  Administered 2024-02-04: 1 via ORAL
  Filled 2024-02-04: qty 1

## 2024-02-04 NOTE — ED Notes (Signed)
 Patient refused a wheelchair for discharge. Patient did well with putting on his sweatshirt with one assist and sling was put back in place. Patient left with a steady gait with his parents.

## 2024-02-04 NOTE — Discharge Instructions (Addendum)
 We believe that your symptoms are caused by a musculoskeletal strain or tear to one of your chest muscles.  Please read through the included information about additional care such as heating pads, over-the-counter pain medicine, icing for the first 24 hours. Remember that early mobility and using the affected part of your body is actually better than keeping it immobile.  You were prescribed Meloxicam  (antiinflammatory) and Methocarbamol (muscle relaxer) to help with your pain.  Please take these medications only as prescribed. Please do not work, make legal-binding decisions, or operate a motor vehicle while taking the Methocarbamol.  Please do not take Ibuprofen , Aleve , Advil , Motrin , Naproxen , Aspirin, or any other non-steroidal antiinflammatory drug (NSAID) while taking the Meloxicam . Please stop taking the Meloxicam  if you experience any stomach cramping. I have also sent in Tylenol  as well. Wear the shoulder sling until reevaluated by orthopedics.  Follow-up with the doctor listed (Dr. Lorelle) as recommended or return to the emergency department with new or worsening symptoms that concern you.
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
## 2024-02-04 NOTE — ED Triage Notes (Signed)
 BIB [facility].  Patient at gym bench pressing 345 lbs when felt right arm pain / peck muscle tear.  STates felt something tear and pull. Pain shooting down arm and tingling sensation to right hand.  + radial pulse. Brisk cap refill.

## 2024-02-08 NOTE — Progress Notes (Signed)
 Chief Complaint: Chief Complaint  Patient presents with   Shoulder Injury    Was lifting 345lbs of weights and felt something tear in his chest 10/10 pain    History of Present Illness Larry Ball is a 29 year old male who presents with right pectoral pain and swelling after a weightlifting injury.  He developed sudden right pectoral pain and swelling while performing a 345-pound flat bench press. On the second repetition he felt a tearing movement in the right pectoral region, followed by the bar falling onto his chest and immediate bruising and discoloration. He has pain with arm movement, especially full stretch, and reports a pulling sensation with limited range of motion and weakness with pushing and overhead activities.  Patient has had bruising and swelling to the right pectoral region along the axillary region.  He has had moderate pain and has been taking muscle relaxers.  X-rays in the ER were negative for any acute bony abnormality      Past Medical History: No past medical history on file.  Past Surgical History: Past Surgical History:  Procedure Laterality Date   knee surgery Right     Past Family History: No family history on file.  Medications: Current Outpatient Medications  Medication Sig Dispense Refill   lurasidone (LATUDA) 20 mg tablet Take 1 tablet (20 mg total) by mouth once daily 30 tablet 1   No current facility-administered medications for this visit.    Allergies: No Known Allergies   Review of Systems:  A comprehensive 14 point ROS was performed, reviewed by me today, and the pertinent orthopaedic findings are documented in the HPI.   Exam: BP (!) 140/100   Ht 177.8 cm (5' 10)   Wt (!) 104.1 kg (229 lb 9.6 oz)   BMI 32.94 kg/m  General/Constitutional: The patient appears to be well-nourished, well-developed, and in no acute distress. Neuro/Psych: Normal mood and affect, oriented to person, place and time. Eyes: Non-icteric.   Pupils are equal, round, and reactive to light, and exhibit synchronous movement. ENT: Unremarkable. Lymphatic: No palpable adenopathy. Respiratory: Non-labored breathing Cardiovascular: No edema, swelling or tenderness, except as noted in detailed exam. Integumentary: No impressive skin lesions present, except as noted in detailed exam. Musculoskeletal: Unremarkable, except as noted in detailed exam.  Right upper extremity shows mild swelling and bruising along the axillary region along the lateral pectoralis.  There is noticeable bruising swelling and tenderness.  He has pain with internal and external rotation resistance.  He has full active range of motion of the shoulder no weakness with resisted abduction or flexion.  He has painful resisted pushing with the right upper extremity.  He is neurovascular intact.  Compartments are soft.   EXAM:  1 VIEW(S) XRAY OF THE RIGHT SHOULDER  02/04/2024 12:47:00 PM   COMPARISON:  None available.   CLINICAL HISTORY:  Right shoulder and chest injury.   FINDINGS:   BONES AND JOINTS:  Glenohumeral joint is normally aligned. No acute fracture. No malalignment. The  [facility] joint is unremarkable.   SOFT TISSUES:  No abnormal calcifications. Visualized lung is unremarkable.   IMPRESSION:  1. No acute fracture or dislocation.   Electronically signed by: Dayne Hassell MD 02/04/2024 01:24 PM EST RP  Workstation: [id number]   Impression: Rupture of pectoralis major muscle, initial encounter [S29.011A] Rupture of pectoralis major muscle, initial encounter  (primary encounter diagnosis)  Plan:  Assessment & Plan 29 year old male presented with acute right pectoral pain, swelling, bruising, and a palpable lump  following a weightlifting incident during bench pressing. He reported immediate pain, sensation of tearing, and subsequent weakness with arm extension and overhead movements. Physical exam revealed notable swelling and discoloration of the right  pectoral region, with pain on movement. No neurological deficits or other injuries were identified.  I suspect a right pectoral muscle tear.  Will order MRI of the chest without contrast.  He will call after MRI to discuss results.  Will discuss results with surgeon and discussed proper follow-up.  He will continue with sling, muscle relaxers.    This note was generated in part with voice recognition software and I apologize for any typographical errors that were not detected and corrected.   Debby Lonni Amber MPA-C

## 2024-02-09 ENCOUNTER — Other Ambulatory Visit: Payer: Self-pay | Admitting: Orthopedic Surgery

## 2024-02-09 ENCOUNTER — Ambulatory Visit
Admission: RE | Admit: 2024-02-09 | Discharge: 2024-02-09 | Disposition: A | Discharge status: Home / Self Care | Source: Ambulatory Visit | Attending: Orthopedic Surgery | Admitting: Orthopedic Surgery

## 2024-02-09 DIAGNOSIS — S29011A Strain of muscle and tendon of front wall of thorax, initial encounter: Secondary | ICD-10-CM | POA: Insufficient documentation

## 2024-02-11 NOTE — Progress Notes (Signed)
 Chief Complaint: Chief Complaint  Patient presents with   Chest - Pain    Discuss MRI results    Larry Ball is a 29 y.o. male who presents today for repeat evaluation of a chest wall injury and discussion of recent chest MRI scan.  The patient was performing a bench press where he was bench pressing 345 pounds.  On his 2nd-3rd repetition as he began to push he felt a pop on the right chest wall and had immediate pain and swelling and dropped the bar directly on his chest as well.  Since then he has had pain and swelling along the right pectoralis musculature with deformity identified as well.  The patient went to the ER where x-rays were negative for any acute bony abnormality and the patient was given meloxicam  to take as needed for discomfort.  The patient was evaluated by Medford Amber, PA-C where there was concern for a pectoralis muscle rupture and a MRI scan of the chest wall was obtained.  He presents today reporting continued pain along the right chest wall.  He reports weakness when trying to push objects out from his body with the right arm.  He is right-hand dominant.  No surgical history to the right shoulder.  He denies any personal history of heart attack, stroke, asthma or COPD.  No personal history of DVT.  He is not diabetic.  He does have a history of seizure-like activity.  Past Medical History: No past medical history on file.  Past Surgical History: Past Surgical History:  Procedure Laterality Date   knee surgery Right     Past Family History: History reviewed. No pertinent family history.  Medications: Current Outpatient Medications  Medication Sig Dispense Refill   acetaminophen  (TYLENOL ) 500 MG tablet Take 2 tablets (1,000 mg total) by mouth every 6 (six) hours as needed for up to 14 days for moderate pain (pain score 4-6) or mild pain (pain score 1-3).     buPROPion  (WELLBUTRIN ) 75 MG tablet Take 75 mg by mouth once daily     divalproex  (DEPAKOTE ) 250  MG DR tablet Take 1 tablet (250 mg total) by mouth every 12 (twelve) hours.     hydrOXYzine  (ATARAX ) 25 MG tablet Take 25 mg by mouth every 8 (eight) hours as needed     lacoSAMide (VIMPAT) 150 mg tablet Take 150 mg by mouth 2 (two) times daily     meloxicam  (MOBIC ) 15 MG tablet Take 15 mg by mouth     methocarbamol  (ROBAXIN ) 500 MG tablet Take 1 tablet (500 mg total) by mouth every 8 (eight) hours as needed for up to 14 days.     mirtazapine (REMERON) 15 MG tablet Take 15 mg by mouth at bedtime     HYDROcodone -acetaminophen  (NORCO) 5-325 mg tablet Take 1 tablet by mouth every 8 (eight) hours as needed for Pain 15 tablet 0   lurasidone (LATUDA) 20 mg tablet Take 1 tablet (20 mg total) by mouth once daily 30 tablet 1   No current facility-administered medications for this visit.    Allergies: No Known Allergies   Review of Systems:  A comprehensive 14 point ROS was performed, reviewed by me today, and the pertinent orthopaedic findings are documented in the HPI.   Exam: BP 130/74   Ht 177.8 cm (5' 10)   Wt (!) 104.1 kg (229 lb 9.6 oz)   BMI 32.94 kg/m  General/Constitutional: The patient appears to be well-nourished, well-developed, and in no acute distress. Neuro/Psych:  Normal mood and affect, oriented to person, place and time. Eyes: Non-icteric.  Pupils are equal, round, and reactive to light, and exhibit synchronous movement. ENT: Unremarkable. Lymphatic: No palpable adenopathy. Respiratory: Lungs clear to auscultation, Normal chest excursion, No wheezes, and Non-labored breathing Cardiovascular: Regular rate and rhythm.  No murmurs. and No edema, swelling or tenderness, except as noted in detailed exam. Integumentary: No impressive skin lesions present, except as noted in detailed exam. Musculoskeletal: Unremarkable, except as noted in detailed exam.  Right upper extremity shows mild swelling and bruising along the axillary region along the lateral pectoralis. There is  noticeable bruising swelling and tenderness. He has pain with internal and external rotation resistance. He has full active range of motion of the shoulder no weakness with resisted abduction or flexion. He has painful resisted pushing with the right upper extremity. He is neurovascular intact. Compartments are soft.  Deformity noted to the right pectoralis musculature.  Imaging: MR CHEST WITHOUT CONTRAST obtained on 02/09/24  TECHNIQUE:  Multiplanar, multisequence MR imaging of the chest was performed. No  intravenous contrast was administered.   COMPARISON:  None Available.   FINDINGS:  Soft tissue and Muscles  High-grade tear at the myotendinous junction of the right pectoralis  major muscle, predominantly involving the sternal head, with  approximately 2.4 cm retraction gap and associated intramuscular and  interstitial edema and hematoma. Overlying subcutaneous edema. The  humeral insertion of the pectoralis major tendon appears intact. The  sternal and clavicular muscle origin appears intact.   Bones/Joint   No fracture or dislocation. Os acromiale is noted. Normal alignment.  No joint effusion.   IMPRESSION:  High-grade tear at the myotendinous junction of the right pectoralis  major muscle, predominantly involving the sternal head, with  approximately 2.4 cm retraction and associated edema and hematoma.  The humeral insertion of the pectoralis major tendon appears intact.   Impression: Rupture of pectoralis major muscle, sequela [S29.011S] Rupture of pectoralis major muscle, sequela  (primary encounter diagnosis) Injury of chest wall, sequela  Plan:  1.  Treatment options were discussed today with the patient. 2.  Patient presents today with a high-grade tear of the right pectoralis major muscle. 3.  A discussion of the risk and benefits of conservative and aggressive treatment options were discussed today with the patient. 4.  After a discussion of the risk and  benefits the patient would like to proceed with repair of the right pectoralis major tendon rupture. 5.  Surgery will be scheduled with Dr. Edie next week.  We discussed the appropriate postoperative protocol. 6.  The patient did request something slightly stronger for pain.  Given his history of seizure decided against tramadol, low-dose hydrocodone  was sent in for the patient. 7.  This document will serve as a surgical history and physical for the patient. 8.  The patient will follow-up per standard postop protocol.  They can call the clinic they have any questions, new symptoms develop or symptoms worsen.  The procedure was discussed with the patient, as were the potential risks (including bleeding, infection, nerve and/or blood vessel injury, persistent or recurrent pain, failure of the repair, progression of arthritis, need for further surgery, blood clots, strokes, heart attacks and/or arhythmias, pneumonia, etc.) and benefits.  The patient states his understanding and wishes to proceed.  This office visit took 45 minutes, of which >50% involved patient counseling/education.  Review of the [place] CSRS was performed in accordance of the [facility] prior to dispensing any controlled drugs.  This  note was generated in part with voice recognition software and I apologize for any typographical errors that were not detected and corrected.  DOROTHA Gustavo Level, PA-C, CAQ-OS [facility]

## 2024-02-14 ENCOUNTER — Other Ambulatory Visit: Payer: Self-pay | Admitting: Surgery

## 2024-02-14 ENCOUNTER — Encounter
Admission: RE | Admit: 2024-02-14 | Discharge: 2024-02-14 | Disposition: A | Source: Ambulatory Visit | Attending: Surgery

## 2024-02-14 ENCOUNTER — Other Ambulatory Visit: Payer: Self-pay

## 2024-02-14 HISTORY — DX: Epilepsy, unspecified, not intractable, without status epilepticus: G40.909

## 2024-02-14 HISTORY — DX: Strain of muscle and tendon of front wall of thorax, sequela: S29.011S

## 2024-02-16 ENCOUNTER — Ambulatory Visit

## 2024-02-16 ENCOUNTER — Other Ambulatory Visit: Payer: Self-pay

## 2024-02-16 ENCOUNTER — Encounter: Admission: RE | Disposition: A | Payer: Self-pay | Source: Home / Self Care | Attending: Surgery

## 2024-02-16 ENCOUNTER — Ambulatory Visit: Admitting: Registered Nurse

## 2024-02-16 ENCOUNTER — Ambulatory Visit: Admission: RE | Admit: 2024-02-16 | Discharge: 2024-02-16 | Disposition: A | Attending: Surgery | Admitting: Surgery

## 2024-02-16 ENCOUNTER — Encounter: Payer: Self-pay | Admitting: Surgery

## 2024-02-16 MED ORDER — 0.9 % SODIUM CHLORIDE (POUR BTL) OPTIME
TOPICAL | Status: DC | PRN
  Administered 2024-02-16 (×2): 500 mL

## 2024-02-16 MED ORDER — MIDAZOLAM HCL 2 MG/2ML IJ SOLN
INTRAMUSCULAR | Status: AC
  Filled 2024-02-16: qty 2

## 2024-02-16 MED ORDER — FENTANYL CITRATE (PF) 50 MCG/ML IJ SOSY
PREFILLED_SYRINGE | INTRAMUSCULAR | Status: AC
  Filled 2024-02-16: qty 2

## 2024-02-16 MED ORDER — OXYCODONE HCL 5 MG/5ML PO SOLN: 5.0000 mg | Freq: Once | ORAL | Status: DC | PRN

## 2024-02-16 MED ORDER — FENTANYL CITRATE (PF) 100 MCG/2ML IJ SOLN
INTRAMUSCULAR | Status: DC | PRN
  Administered 2024-02-16: 100 ug via INTRAVENOUS

## 2024-02-16 MED ORDER — ACETAMINOPHEN 500 MG PO TABS
ORAL_TABLET | ORAL | Status: AC
  Filled 2024-02-16: qty 2

## 2024-02-16 MED ORDER — CHLORHEXIDINE GLUCONATE 0.12 % MT SOLN
OROMUCOSAL | Status: AC
  Filled 2024-02-16: qty 15

## 2024-02-16 MED ORDER — LACTATED RINGERS IV SOLN: INTRAVENOUS | Status: DC

## 2024-02-16 MED ORDER — BUPIVACAINE HCL (PF) 0.5 % IJ SOLN
INTRAMUSCULAR | Status: DC | PRN
  Administered 2024-02-16: 10 mL via PERINEURAL

## 2024-02-16 MED ORDER — VANCOMYCIN HCL 1000 MG IV SOLR
INTRAVENOUS | Status: DC | PRN
  Administered 2024-02-16: 1000 mg via TOPICAL

## 2024-02-16 MED ORDER — MIDAZOLAM HCL (PF) 2 MG/2ML IJ SOLN
INTRAMUSCULAR | Status: DC | PRN
  Administered 2024-02-16: 4 mg via INTRAVENOUS

## 2024-02-16 MED ORDER — PHENYLEPHRINE HCL-NACL 20-0.9 MG/250ML-% IV SOLN
INTRAVENOUS | Status: DC | PRN
  Administered 2024-02-16: 10 ug/min via INTRAVENOUS

## 2024-02-16 MED ORDER — BUPIVACAINE HCL (PF) 0.5 % IJ SOLN
INTRAMUSCULAR | Status: AC
  Filled 2024-02-16: qty 10

## 2024-02-16 MED ORDER — OXYCODONE HCL 5 MG PO TABS: 5.0000 mg | ORAL_TABLET | Freq: Once | ORAL | Status: DC | PRN

## 2024-02-16 MED ORDER — PROPOFOL 10 MG/ML IV BOLUS
INTRAVENOUS | Status: AC
  Filled 2024-02-16: qty 20

## 2024-02-16 MED ORDER — SUGAMMADEX SODIUM 200 MG/2ML IV SOLN
INTRAVENOUS | Status: DC | PRN
  Administered 2024-02-16 (×2): 200 mg via INTRAVENOUS

## 2024-02-16 MED ORDER — ROCURONIUM BROMIDE 100 MG/10ML IV SOLN
INTRAVENOUS | Status: DC | PRN
  Administered 2024-02-16: 20 mg via INTRAVENOUS
  Administered 2024-02-16: 80 mg via INTRAVENOUS
  Administered 2024-02-16 (×2): 10 mg via INTRAVENOUS

## 2024-02-16 MED ORDER — DEXAMETHASONE SOD PHOSPHATE PF 10 MG/ML IJ SOLN
INTRAMUSCULAR | Status: DC | PRN
  Administered 2024-02-16: 10 mg via INTRAVENOUS

## 2024-02-16 MED ORDER — PHENYLEPHRINE HCL-NACL 20-0.9 MG/250ML-% IV SOLN
INTRAVENOUS | Status: AC
  Filled 2024-02-16: qty 250

## 2024-02-16 MED ORDER — PROPOFOL 10 MG/ML IV BOLUS
INTRAVENOUS | Status: DC | PRN
  Administered 2024-02-16: 200 mg via INTRAVENOUS

## 2024-02-16 MED ORDER — KETOROLAC TROMETHAMINE 30 MG/ML IJ SOLN: 30.0000 mg | Freq: Once | INTRAMUSCULAR | Status: DC

## 2024-02-16 MED ORDER — GABAPENTIN 300 MG PO CAPS
300.0000 mg | ORAL_CAPSULE | Freq: Once | ORAL | Status: AC
  Administered 2024-02-16: 300 mg via ORAL

## 2024-02-16 MED ORDER — DROPERIDOL 2.5 MG/ML IJ SOLN: 0.6250 mg | Freq: Once | INTRAMUSCULAR | Status: DC | PRN

## 2024-02-16 MED ORDER — LIDOCAINE HCL (PF) 2 % IJ SOLN
INTRAMUSCULAR | Status: AC
  Filled 2024-02-16: qty 5

## 2024-02-16 MED ORDER — CELECOXIB 200 MG PO CAPS
200.0000 mg | ORAL_CAPSULE | Freq: Once | ORAL | Status: AC
  Administered 2024-02-16: 200 mg via ORAL

## 2024-02-16 MED ORDER — VANCOMYCIN HCL 1000 MG IV SOLR
INTRAVENOUS | Status: AC
  Filled 2024-02-16: qty 20

## 2024-02-16 MED ORDER — DEXMEDETOMIDINE HCL IN NACL 80 MCG/20ML IV SOLN
INTRAVENOUS | Status: DC | PRN
  Administered 2024-02-16: 12 ug via INTRAVENOUS

## 2024-02-16 MED ORDER — OXYCODONE HCL 5 MG PO TABS: 5.0000 mg | ORAL_TABLET | ORAL | 0 refills | Status: AC | PRN

## 2024-02-16 MED ORDER — ACETAMINOPHEN 500 MG PO TABS
1000.0000 mg | ORAL_TABLET | Freq: Once | ORAL | Status: AC
  Administered 2024-02-16: 1000 mg via ORAL

## 2024-02-16 MED ORDER — ACETAMINOPHEN 10 MG/ML IV SOLN: 1000.0000 mg | Freq: Once | INTRAVENOUS | Status: DC | PRN

## 2024-02-16 MED ORDER — BUPIVACAINE LIPOSOME 1.3 % IJ SUSP
INTRAMUSCULAR | Status: DC | PRN
  Administered 2024-02-16: 10 mL via PERINEURAL

## 2024-02-16 MED ORDER — DEXAMETHASONE SOD PHOSPHATE PF 10 MG/ML IJ SOLN
INTRAMUSCULAR | Status: AC
  Filled 2024-02-16: qty 1

## 2024-02-16 MED ORDER — CEFAZOLIN SODIUM-DEXTROSE 2-4 GM/100ML-% IV SOLN
INTRAVENOUS | Status: AC
  Filled 2024-02-16: qty 100

## 2024-02-16 MED ORDER — GABAPENTIN 300 MG PO CAPS
ORAL_CAPSULE | ORAL | Status: AC
  Filled 2024-02-16: qty 1

## 2024-02-16 MED ORDER — BUPIVACAINE-EPINEPHRINE (PF) 0.5% -1:200000 IJ SOLN
INTRAMUSCULAR | Status: AC
  Filled 2024-02-16: qty 30

## 2024-02-16 MED ORDER — BUPIVACAINE LIPOSOME 1.3 % IJ SUSP
INTRAMUSCULAR | Status: AC
  Filled 2024-02-16: qty 10

## 2024-02-16 MED ORDER — PHENYLEPHRINE 80 MCG/ML (10ML) SYRINGE FOR IV PUSH (FOR BLOOD PRESSURE SUPPORT)
PREFILLED_SYRINGE | INTRAVENOUS | Status: DC | PRN
  Administered 2024-02-16: 40 ug via INTRAVENOUS
  Administered 2024-02-16 (×4): 80 ug via INTRAVENOUS
  Administered 2024-02-16: 120 ug via INTRAVENOUS
  Administered 2024-02-16 (×2): 80 ug via INTRAVENOUS

## 2024-02-16 MED ORDER — ONDANSETRON HCL 4 MG/2ML IJ SOLN
INTRAMUSCULAR | Status: DC | PRN
  Administered 2024-02-16: 4 mg via INTRAVENOUS

## 2024-02-16 MED ORDER — CELECOXIB 200 MG PO CAPS
ORAL_CAPSULE | ORAL | Status: AC
  Filled 2024-02-16: qty 1

## 2024-02-16 MED ORDER — CHLORHEXIDINE GLUCONATE 0.12 % MT SOLN
15.0000 mL | Freq: Once | OROMUCOSAL | Status: AC
  Administered 2024-02-16: 15 mL via OROMUCOSAL

## 2024-02-16 MED ORDER — BUPIVACAINE-EPINEPHRINE 0.5% -1:200000 IJ SOLN
INTRAMUSCULAR | Status: DC | PRN
  Administered 2024-02-16: 40 mL

## 2024-02-16 MED ORDER — ONDANSETRON HCL 4 MG/2ML IJ SOLN
INTRAMUSCULAR | Status: AC
  Filled 2024-02-16: qty 2

## 2024-02-16 MED ORDER — ORAL CARE MOUTH RINSE: 15.0000 mL | Freq: Once | OROMUCOSAL | Status: AC

## 2024-02-16 MED ORDER — FENTANYL CITRATE (PF) 100 MCG/2ML IJ SOLN: 25.0000 ug | INTRAMUSCULAR | Status: DC | PRN

## 2024-02-16 MED ORDER — CEFAZOLIN SODIUM-DEXTROSE 2-4 GM/100ML-% IV SOLN
2.0000 g | INTRAVENOUS | Status: AC
  Administered 2024-02-16: 2 g via INTRAVENOUS

## 2024-02-16 NOTE — H&P (Signed)
 History of Present Illness: Larry Ball is a 29 y.o. male who presents today for repeat evaluation of a chest wall injury and discussion of recent chest MRI scan. The patient was performing a bench press where he was bench pressing 345 pounds. On his 2nd-3rd repetition as he began to push he felt a pop on the right chest wall and had immediate pain and swelling and dropped the bar directly on his chest as well. Since then he has had pain and swelling along the right pectoralis musculature with deformity identified as well. The patient went to the ER where x-rays were negative for any acute bony abnormality and the patient was given meloxicam  to take as needed for discomfort. The patient was evaluated by Medford Amber, PA-C where there was concern for a pectoralis muscle rupture and a MRI scan of the chest wall was obtained. He presents today reporting continued pain along the right chest wall. He reports weakness when trying to push objects out from his body with the right arm. He is right-hand dominant. No surgical history to the right shoulder. He denies any personal history of heart attack, stroke, asthma or COPD. No personal history of DVT. He is not diabetic. He does have a history of seizure-like activity.  Past Medical History: No past medical history on file.  Past Surgical History: Right knee surgery   Past Family History: No pertinent family history.  Medications: acetaminophen  (TYLENOL ) 500 MG tablet Take 2 tablets (1,000 mg total) by mouth every 6 (six) hours as needed for up to 14 days for moderate pain (pain score 4-6) or mild pain (pain score 1-3).  buPROPion  (WELLBUTRIN ) 75 MG tablet Take 75 mg by mouth once daily  divalproex  (DEPAKOTE ) 250 MG DR tablet Take 1 tablet (250 mg total) by mouth every 12 (twelve) hours.  hydrOXYzine  (ATARAX ) 25 MG tablet Take 25 mg by mouth every 8 (eight) hours as needed  lacoSAMide (VIMPAT) 150 mg tablet Take 150 mg by mouth 2 (two) times daily   meloxicam  (MOBIC ) 15 MG tablet Take 15 mg by mouth  methocarbamol  (ROBAXIN ) 500 MG tablet Take 1 tablet (500 mg total) by mouth every 8 (eight) hours as needed for up to 14 days.  mirtazapine (REMERON) 15 MG tablet Take 15 mg by mouth at bedtime  HYDROcodone -acetaminophen  (NORCO) 5-325 mg tablet Take 1 tablet by mouth every 8 (eight) hours as needed for Pain 15 tablet 0  lurasidone (LATUDA) 20 mg tablet Take 1 tablet (20 mg total) by mouth once daily 30 tablet 1   Allergies: No Known Allergies   Review of Systems:  A comprehensive 14 point ROS was performed, reviewed by me today, and the pertinent orthopaedic findings are documented in the HPI.  Physical Exam: BP 130/74  Ht 177.8 cm (5' 10)  Wt (!) 104.1 kg (229 lb 9.6 oz)  BMI 32.94 kg/m  General/Constitutional: The patient appears to be well-nourished, well-developed, and in no acute distress. Neuro/Psych: Normal mood and affect, oriented to person, place and time. Eyes: Non-icteric. Pupils are equal, round, and reactive to light, and exhibit synchronous movement. ENT: Unremarkable. Lymphatic: No palpable adenopathy. Respiratory: Lungs clear to auscultation, Normal chest excursion, No wheezes, and Non-labored breathing Cardiovascular: Regular rate and rhythm. No murmurs. and No edema, swelling or tenderness, except as noted in detailed exam. Integumentary: No impressive skin lesions present, except as noted in detailed exam. Musculoskeletal: Unremarkable, except as noted in detailed exam.  Right upper extremity shows mild swelling and bruising along  the axillary region along the lateral pectoralis. There is noticeable bruising swelling and tenderness. He has pain with internal and external rotation resistance. He has full active range of motion of the shoulder no weakness with resisted abduction or flexion. He has painful resisted pushing with the right upper extremity. He is neurovascular intact. Compartments are soft. Deformity  noted to the right pectoralis musculature.  Imaging: MR CHEST WITHOUT CONTRAST:  High-grade tear at the myotendinous junction of the right pectoralis  major muscle, predominantly involving the sternal head, with  approximately 2.4 cm retraction and associated edema and hematoma.  The humeral insertion of the pectoralis major tendon appears intact.   Impression: Rupture of right pectoralis major muscle.  Plan:  1. Treatment options were discussed today with the patient. 2. Patient presents today with a high-grade tear of the right pectoralis major muscle. 3. A discussion of the risk and benefits of conservative and aggressive treatment options were discussed today with the patient. 4. After a discussion of the risk and benefits the patient would like to proceed with repair of the right pectoralis major tendon rupture. 5. Surgery will be scheduled with Dr. Edie next week. We discussed the appropriate postoperative protocol. 6. The patient did request something slightly stronger for pain. Given his history of seizure decided against tramadol, low-dose hydrocodone  was sent in for the patient. 7. This document will serve as a surgical history and physical for the patient. 8. The patient will follow-up per standard postop protocol. They can call the clinic they have any questions, new symptoms develop or symptoms worsen.  The procedure was discussed with the patient, as were the potential risks (including bleeding, infection, nerve and/or blood vessel injury, persistent or recurrent pain, failure of the repair, progression of arthritis, need for further surgery, blood clots, strokes, heart attacks and/or arhythmias, pneumonia, etc.) and benefits. The patient states his understanding and wishes to proceed.    H&P reviewed and patient re-examined. No changes.

## 2024-02-16 NOTE — Anesthesia Preprocedure Evaluation (Addendum)
"                                    Anesthesia Evaluation  Patient identified by MRN, date of birth, ID band Patient awake    Reviewed: Allergy & Precautions, H&P , NPO status , Patient's Chart, lab work & pertinent test results  Airway Mallampati: II  TM Distance: >3 FB Neck ROM: full    Dental no notable dental hx.    Pulmonary asthma    Pulmonary exam normal        Cardiovascular negative cardio ROS Normal cardiovascular exam     Neuro/Psych  PSYCHIATRIC DISORDERS   Bipolar Disorder   Tremor negative neurological ROS     GI/Hepatic negative GI ROS, Neg liver ROS,,,  Endo/Other  negative endocrine ROS    Renal/GU      Musculoskeletal   Abdominal Normal abdominal exam  (+)   Peds  Hematology negative hematology ROS (+)   Anesthesia Other Findings Past Medical History: No date: Asthma No date: Bipolar 1 disorder (HCC) No date: Epilepsy (HCC) No date: Rupture of pectoralis major muscle, sequela  Past Surgical History: No date: HERNIA REPAIR     Comment:  as a child 2016: KNEE SURGERY; Right     Reproductive/Obstetrics negative OB ROS                              Anesthesia Physical Anesthesia Plan  ASA: 2  Anesthesia Plan: General ETT   Post-op Pain Management: Tylenol  PO (pre-op)*, Gabapentin  PO (pre-op)*, Regional block* and Celebrex  PO (pre-op)*   Induction: Intravenous  PONV Risk Score and Plan: 2 and Ondansetron , Dexamethasone  and Midazolam   Airway Management Planned: Oral ETT  Additional Equipment:   Intra-op Plan:   Post-operative Plan: Extubation in OR  Informed Consent: I have reviewed the patients History and Physical, chart, labs and discussed the procedure including the risks, benefits and alternatives for the proposed anesthesia with the patient or authorized representative who has indicated his/her understanding and acceptance.     Dental Advisory Given  Plan Discussed with: CRNA  and Surgeon  Anesthesia Plan Comments:          Anesthesia Quick Evaluation  "

## 2024-02-16 NOTE — Anesthesia Procedure Notes (Signed)
 Anesthesia Regional Block: Interscalene brachial plexus block   Pre-Anesthetic Checklist: , timeout performed,  Correct Patient, Correct Site, Correct Laterality,  Correct Procedure, Correct Position, site marked,  Risks and benefits discussed,  Surgical consent,  Pre-op evaluation,  At surgeon's request and post-op pain management  Laterality: Upper and Right  Prep: chloraprep       Needles:  Injection technique: Single-shot  Needle Type: Stimiplex     Needle Length: 9cm  Needle Gauge: 22     Additional Needles:   Procedures:,,,, ultrasound used (permanent image in chart),,    Narrative:  Start time: 02/16/2024 10:09 AM End time: 02/16/2024 10:15 AM Injection made incrementally with aspirations every 5 mL.  Performed by: Personally  Anesthesiologist: Vicci Camellia Glatter, MD  Additional Notes: Patient consented for risk and benefits of nerve block including but not limited to nerve damage, failed block, bleeding and infection.  Patient voiced understanding.  Functioning IV was confirmed and monitors were applied.  Timeout done prior to procedure and prior to any sedation being given to the patient.  Patient confirmed procedure site prior to any sedation given to the patient. Sterile prep,hand hygiene and sterile gloves were used.  Minimal sedation used for procedure.  No paresthesia endorsed by patient during the procedure.  Negative aspiration and negative test dose prior to incremental administration of local anesthetic. The patient tolerated the procedure well with no immediate complications.

## 2024-02-16 NOTE — Op Note (Signed)
 02/16/2024  1:19 PM  Patient:   Larry Ball  Pre-Op Diagnosis:   Rupture of pectoralis major muscle, right shoulder.  Post-Op Diagnosis:   Same  Procedure:   Primary repair of ruptured right pectoralis major tendon.  Surgeon:   DOROTHA Reyes Maltos, MD  Assistant:   Sherran Bertrand, RN  Anesthesia:   GET with interscalene block using Exparel  placed preoperatively by anesthesiologist  Findings:   As above.  Complications:   None  Fluids:   850 cc crystalloid  EBL:   50 cc  UOP:   None  TT:   None  Drains:   None  Closure:   Staples  Implants:   Arthrex pec repair system with 3 unicortical buttons.  Brief Clinical Note:   The patient is a 29 year old male who sustained the above-noted injury approximate 12 days ago while weightlifting.  Specifically, he apparently was trying to benchpress 345 pounds when he felt his muscle tear.  The patient was seen in the orthopedic clinic and an MRI scan confirmed the presence of this injury.  He presents at this time for definitive management of this injury.  Procedure:   The patient underwent placement of an interscalene block by the anesthesiologist in the preoperative holding area before being brought into the operating room and lain in the supine position.  The patient then underwent general endotracheal intubation and anesthesia before the patient was repositioned in the beach chair position using the beach chair positioner.  The right shoulder and upper extremity were prepped with ChloraPrep solution before being draped sterilely.  The patient was placed into the Spider device to help with positioning the arm and preoperative antibiotics were administered.  A timeout was performed to verify the appropriate surgical site.  An approximately 8 to 10 cm incision was made anteriorly in the axillary crease.  This incision was carried down through the subcutaneous tissues to expose the clavipectoral fascia.  This fascia was divided to  better identify the pectoralis major muscle and subcutaneous flaps developed medially and laterally for better visualization.  The cephalic vein was identified laterally while the separate clavicular and sternal heads of the pectoralis major muscle were identified as well.  The interval between the sternal and clavicular heads was developed bluntly using digital dissection.  The inferior most portion of the sternal head was identified and this plane developed to provide better access into the deep surface of the pectoralis major muscle.  It was noted that some of the fascial tissue was still intact and that much of the muscle had stripped off the tendinous tissue.  After verifying the insertion on the humerus, the sternal head of the pectoralis major muscle was tagged with several #2 FiberWire sutures.  The remaining fibers were released in preparation for repair.  Three 2 mm Arthrex fiber tapes were woven in a modified Krakw fashion through the tendinous tissue to secure the torn portion of the pectoralis major muscle.  Lateral traction on this tendon demonstrated excellent and secure fixation of the tendon by these tapes.  Laterally, the anterior surface of the humerus was exposed just lateral to the long head of the biceps tendon there was scuffed with a key elevator to stimulate healing before 3 drill holes of appropriate size were placed in a slightly staggered fashion along the footprint of the pectoralis major tendon.  Each set of fiber tapes was passed through the eyelet of the unicortical anchors in the manufacture recommended manner before being dunked into their  respective drill hole.  Care was taken to be sure they were fully seated before being disengaged from the handle and flipped by pulling on the sutures.  Once all 3 anchors were deployed properly, each limb of each suture tape was pulled sequentially to tighten the sutures and to pull the tendon laterally to the humeral insertion site.  Once  each was fully tightened, 1 limb was passed through the tendinous tissue using a free needle then tied securely to reinforce this repair.  Given the quality of the tissue and the expectation of the patient to return to heavy weight lifting, it was felt best to reinforce this repair.  Therefore, a 30 x 30 mm Zimmer Biomet Tapestry Biointegrative implant was applied over the tendinous tissue and secured using #0 Vicryl interrupted sutures at each corner.  This was placed after irrigating the wound thoroughly with sterile saline solution using bulb irrigation.  This additional procedure added a level of complexity as well as an additional 20 to 30 minutes of surgical time to the procedure.  The wound was sprinkled with 1 g of vancomycin  powder before being closed in 2 layers using 2-0 Vicryl interrupted sutures for the subcutaneous tissues.  The skin was closed using staples.  A total of 30 cc of half percent Sensorcaine  with epinephrine  and 10 cc of Exparel  was injected in around the incision to help with postoperative analgesia before a sterile occlusive dressing was applied to the wound.  A Polar Care device was applied over the shoulder before the patient was then placed into a shoulder immobilizer.  The patient was then awakened, extubated, and returned to the recovery room in satisfactory condition after tolerating the procedure well.

## 2024-02-16 NOTE — Transfer of Care (Signed)
 Immediate Anesthesia Transfer of Care Note  Patient: Larry Ball  Procedure(s) Performed: REPAIR, TENDON, PECTORALIS (Right: Chest)  Patient Location: PACU  Anesthesia Type:General and Regional  Level of Consciousness: drowsy and patient cooperative  Airway & Oxygen Therapy: Patient Spontanous Breathing and Patient connected to nasal cannula oxygen  Post-op Assessment: Report given to RN and Post -op Vital signs reviewed and stable  Post vital signs: Reviewed and stable  Last Vitals:  Vitals Value Taken Time  BP 128/55 02/16/24 13:23  Temp 98.2   Pulse 91 02/16/24 13:26  Resp 23 02/16/24 13:26  SpO2 97 % 02/16/24 13:26  Vitals shown include unfiled device data.  Last Pain:  Vitals:   02/16/24 0836  TempSrc: Tympanic  PainSc: 10-Worst pain ever         Complications: No notable events documented.

## 2024-02-16 NOTE — Anesthesia Procedure Notes (Signed)
 Procedure Name: Intubation Date/Time: 02/16/2024 10:38 AM  Performed by: Urvi Imes, CRNAPre-anesthesia Checklist: Patient identified, Patient being monitored, Timeout performed, Emergency Drugs available and Suction available Patient Re-evaluated:Patient Re-evaluated prior to induction Oxygen Delivery Method: Circle system utilized Preoxygenation: Pre-oxygenation with 100% oxygen Induction Type: IV induction Ventilation: Mask ventilation without difficulty Laryngoscope Size: McGrath and 4 Grade View: Grade I Tube type: Oral Tube size: 7.5 mm Number of attempts: 1 Airway Equipment and Method: Stylet Placement Confirmation: ETT inserted through vocal cords under direct vision, positive ETCO2 and breath sounds checked- equal and bilateral Secured at: 23.5 cm Tube secured with: Tape Dental Injury: Teeth and Oropharynx as per pre-operative assessment

## 2024-02-16 NOTE — Discharge Instructions (Addendum)
 Orthopedic discharge instructions: Keep dressing dry and intact.  May sponge bathe only. Apply ice frequently to shoulder or use Polar Care device. Take ibuprofen  600-800 mg TID with meals for 3-5 days, then as necessary. Take oxycodone  as prescribed when needed.  May supplement with ES Tylenol  if necessary. Keep shoulder immobilizer on at all times. Follow-up in 10-14 days or as scheduled.Information for Discharge Teaching:   Information for Discharge Teaching: EXPAREL  (bupivacaine  liposome injectable suspension)   Pain relief is important to your recovery. The goal is to control your pain so you can move easier and return to your normal activities as soon as possible after your procedure. Your physician may use several types of medicines to manage pain, swelling, and more.  Your surgeon or anesthesiologist gave you EXPAREL (bupivacaine ) to help control your pain after surgery.  EXPAREL  is a local anesthetic designed to release slowly over an extended period of time to provide pain relief by numbing the tissue around the surgical site. EXPAREL  is designed to release pain medication over time and can control pain for up to 72 hours. Depending on how you respond to EXPAREL , you may require less pain medication during your recovery. EXPAREL  can help reduce or eliminate the need for opioids during the first few days after surgery when pain relief is needed the most. EXPAREL  is not an opioid and is not addictive. It does not cause sleepiness or sedation.   Important! A teal colored band has been placed on your arm with the date, time and amount of EXPAREL  you have received. Please leave this armband in place for the full 96 hours following administration, and then you may remove the band. If you return to the hospital for any reason within 96 hours following the administration of EXPAREL , the armband provides important information that your health care providers to know, and alerts them that you  have received this anesthetic.    Possible side effects of EXPAREL : Temporary loss of sensation or ability to move in the area where medication was injected. Nausea, vomiting, constipation Rarely, numbness and tingling in your mouth or lips, lightheadedness, or anxiety may occur. Call your doctor right away if you think you may be experiencing any of these sensations, or if you have other questions regarding possible side effects.  Follow all other discharge instructions given to you by your surgeon or nurse. Eat a healthy diet and drink plenty of water or other fluids.SHOULDER SLING IMMOBILIZER   VIDEO Slingshot 2 Shoulder Brace Application - YouTube ---[email or web address].info/  INSTRUCTIONS While supporting the injured arm, slide the forearm into the sling. Wrap the adjustable shoulder strap around the neck and shoulders and attach the strap end to the sling using  the alligator strap tab.  Adjust the shoulder strap to the required length. Position the shoulder pad behind the neck. To secure the shoulder pad location (optional), pull the shoulder strap away from the shoulder pad, unfold the hook material on the top of the pad, then press the shoulder strap back onto the hook material to secure the pad in place. Attach the closure strap across the open top of the sling. Position the strap so that it holds the arm securely in the sling. Next, attach the thumb strap to the open end of the sling between the thumb and fingers. After sling has been fit, it may be easily removed and reapplied using the quick release buckle on shoulder strap. If a neutral pillow or 15 abduction pillow is included, place the pillow  at the waistline. Attach the sling to the pillow, lining up hook material on the pillow with the loop on sling. Adjust the waist strap to fit.  If waist strap is too long, cut it to fit. Use the small piece of double sided hook material (located on top of the pillow) to  secure the strap end. Place the double sided hook material on the inside of the cut strap end and secure it to the waist strap.     If no pillow is included, attach the waist strap to the sling and adjust to fit.    Washing Instructions: Straps and sling must be removed and cleaned regularly depending on your activity level and perspiration. Hand wash straps and sling in cold water with mild detergent, rinse, air dry   EXPAREL  (bupivacaine  liposome injectable suspension)   Pain relief is important to your recovery. The goal is to control your pain so you can move easier and return to your normal activities as soon as possible after your procedure. Your physician may use several types of medicines to manage pain, swelling, and more.  Your surgeon or anesthesiologist gave you EXPAREL (bupivacaine ) to help control your pain after surgery.  EXPAREL  is a local anesthetic designed to release slowly over an extended period of time to provide pain relief by numbing the tissue around the surgical site. EXPAREL  is designed to release pain medication over time and can control pain for up to 72 hours. Depending on how you respond to EXPAREL , you may require less pain medication during your recovery. EXPAREL  can help reduce or eliminate the need for opioids during the first few days after surgery when pain relief is needed the most. EXPAREL  is not an opioid and is not addictive. It does not cause sleepiness or sedation.   Important! A teal colored band has been placed on your arm with the date, time and amount of EXPAREL  you have received. Please leave this armband in place for the full 96 hours following administration, and then you may remove the band. If you return to the hospital for any reason within 96 hours following the administration of EXPAREL , the armband provides important information that your health care providers to know, and alerts them that you have received this anesthetic.    Possible side  effects of EXPAREL : Temporary loss of sensation or ability to move in the area where medication was injected. Nausea, vomiting, constipation Rarely, numbness and tingling in your mouth or lips, lightheadedness, or anxiety may occur. Call your doctor right away if you think you may be experiencing any of these sensations, or if you have other questions regarding possible side effects.  Follow all other discharge instructions given to you by your surgeon or nurse. Eat a healthy diet and drink plenty of water or other fluids.

## 2024-02-17 ENCOUNTER — Encounter: Payer: Self-pay | Admitting: Surgery

## 2024-02-17 NOTE — Anesthesia Postprocedure Evaluation (Signed)
"   Anesthesia Post Note  Patient: JAQUIS PICKLESIMER  Procedure(s) Performed: REPAIR, TENDON, PECTORALIS (Right: Chest)  Patient location during evaluation: PACU Anesthesia Type: General Level of consciousness: awake and alert Pain management: pain level controlled Vital Signs Assessment: post-procedure vital signs reviewed and stable Respiratory status: spontaneous breathing, nonlabored ventilation and respiratory function stable Cardiovascular status: blood pressure returned to baseline and stable Postop Assessment: no apparent nausea or vomiting Anesthetic complications: no   No notable events documented.   Last Vitals:  Vitals:   02/16/24 1425 02/16/24 1438  BP: 120/71 (!) 113/58  Pulse: 91 95  Resp: (!) 22   Temp: 36.8 C 36.9 C  SpO2: 94% 94%    Last Pain:  Vitals:   02/17/24 0913  TempSrc:   PainSc: 1                  Camellia Merilee Louder      "

## 2024-02-24 ENCOUNTER — Ambulatory Visit
